# Patient Record
Sex: Male | Born: 1955 | Race: White | Hispanic: No | Smoking: Former smoker
Health system: Southern US, Community
[De-identification: ages and names within clinical notes are randomized; demographics above are authoritative.]

## PROBLEM LIST (undated history)

## (undated) DIAGNOSIS — F32A Depression, unspecified: Secondary | ICD-10-CM

## (undated) DIAGNOSIS — J449 Chronic obstructive pulmonary disease, unspecified: Secondary | ICD-10-CM

## (undated) DIAGNOSIS — R443 Hallucinations, unspecified: Secondary | ICD-10-CM

## (undated) DIAGNOSIS — F329 Major depressive disorder, single episode, unspecified: Secondary | ICD-10-CM

## (undated) DIAGNOSIS — J4489 Other specified chronic obstructive pulmonary disease: Secondary | ICD-10-CM

## (undated) DIAGNOSIS — R519 Headache, unspecified: Secondary | ICD-10-CM

## (undated) DIAGNOSIS — R51 Headache: Secondary | ICD-10-CM

## (undated) HISTORY — DX: Headache: R51

## (undated) HISTORY — DX: Headache, unspecified: R51.9

## (undated) HISTORY — DX: Depression, unspecified: F32.A

## (undated) HISTORY — DX: Major depressive disorder, single episode, unspecified: F32.9

## (undated) HISTORY — DX: Other specified chronic obstructive pulmonary disease: J44.89

## (undated) HISTORY — DX: Chronic obstructive pulmonary disease, unspecified: J44.9

---

## 2018-02-04 ENCOUNTER — Telehealth: Payer: Self-pay | Admitting: *Deleted

## 2018-02-04 NOTE — Telephone Encounter (Signed)
Copied from CRM #[id number]. Topic: Appointment Scheduling - Prior Auth Required for Appointment >> Feb 01, 2018  9:27 AM Poole, Shalonda wrote: No appointment has been scheduled. Patient is requesting new patient  appointment with  Dr. Bruce Burchette. His  sister diane Gingerich is a patient of Dr. Burchette. The patient is self pay and her from [place] with depression and anxiety concerns. His best contact number is [phone]  Per scheduling protocol, this appointment requires a prior authorization prior to scheduling.  Route to department's PEC pool. >> Feb 03, 2018 10:06 AM Richardson, Taren N, NT wrote: Pts sister checking status on pt getting approved to see Dr. Burchette. 

## 2018-02-04 NOTE — Telephone Encounter (Signed)
Copied from CRM [id number]. Topic: Appointment Scheduling - Prior Auth Required for Appointment >> Feb 01, 2018  9:27 AM Gaynelle AduPoole, Shalonda wrote: No appointment has been scheduled. Patient is requesting new patient  appointment with  Dr. Evelena PeatBruce Burchette. His  sister diane Steward RosGingerich is a patient of Dr. Caryl NeverBurchette. The patient is self pay and her from [place] with depression and anxiety concerns. His best contact number is [phone]  Per scheduling protocol, this appointment requires a prior authorization prior to scheduling.  Route to department's PEC pool. >> Feb 03, 2018 10:06 AM Arlyss Gandyichardson, Taren N, NT wrote: Pts sister checking status on pt getting approved to see Dr. Caryl NeverBurchette.

## 2018-02-07 NOTE — Telephone Encounter (Signed)
Please let them know I was out of town at conference last week.  OK to set up initial visit.

## 2018-02-11 ENCOUNTER — Ambulatory Visit: Payer: Self-pay | Admitting: Mental Health

## 2018-02-11 ENCOUNTER — Encounter: Payer: Self-pay | Admitting: Mental Health

## 2018-02-11 DIAGNOSIS — F331 Major depressive disorder, recurrent, moderate: Secondary | ICD-10-CM

## 2018-02-11 DIAGNOSIS — F319 Bipolar disorder, unspecified: Secondary | ICD-10-CM

## 2018-02-11 DIAGNOSIS — F411 Generalized anxiety disorder: Secondary | ICD-10-CM

## 2018-02-11 NOTE — Progress Notes (Signed)
[facility] Counselor Initial Adult Exam- Part I  Name: William AgarDavid Frye Date: 02/11/2018 MRN: [id number] DOB: 09/25/1955 PCP: Patient, No Pcp Per  Time spent: 45 minutes   Guardian/Payee: patient  Paperwork requested:  No   Reason for Visit /Presenting Problem: Unmedicated mood swings, currently depressed  Mental Status Exam:   Appearance:   Casual     Behavior:  Appropriate  Motor:  Restlestness  Speech/Language:   Normal Rate  Affect:  Depressed  Mood:  anxious and depressed  Thought process:  moody discouraged  Thought content:    Rumination  Sensory/Perceptual disturbances:    WNL  Orientation:  oriented to person, place and time/date  Attention:  Fair  Concentration:  Good  Memory:  WNL  Fund of knowledge:   Fair  Insight:    Fair  Judgment:   Fair  Impulse Control:  Poor   Reported Symptoms:  Depressed, anxious, poor impulse control, headaches.  Risk Assessment: Danger to Self:  No Self-injurious Behavior: No Danger to Others: No Duty to Warn:no Physical Aggression / Violence:No  Access to Firearms a concern: No  Gang Involvement:No  Patient / guardian was educated about steps to take if suicide or homicide risk level increases between visits: no While future psychiatric events cannot be accurately predicted, the patient does not currently require acute inpatient psychiatric care and does not [facility] meet [facility] criteria.  Substance Abuse History: Current substance abuse: daily smoker 1-2 packs    Past Psychiatric History:   Previous psychological history is significant for bipolar Outpatient Providers:none History of Psych Hospitalization: Yes  Psychological Testing: unknown    Medical History/Surgical History:not reviewed No past medical history on file.    Medications: No current outpatient medications on file.   No current facility-administered medications for this visit.     Allergies not on  file    Diagnoses:    ICD-10-CM   1. Bipolar I disorder (HCC) F31.9   2. Generalized anxiety disorder F41.1   3. Major depressive disorder, recurrent episode, moderate (HCC) F33.1   Abuse History: Victim: none Report needed: no Perpetrator of abuse: no Witness / Exposure to Domestic Violence:  none Protective Services Involvement: no Witness to [employer] Violence:  no   Family / Social History:    Living situation: with sister Sexual Orientation: straight Relationship Status:  divorced  Name of spouse / other:  If a parent, number of children / ages:   none  Support Systems: family  Surveyor, quantityinancial Stress:   Unemployed - no income  Income/Employment/Disability:   Counselling psychologistUnemployed  Military Service: none  Educational History:   Not asked  Religion/Sprituality/World View:    Christian  Any cultural differences that may affect / interfere with treatment:  No  Recreation/Hobbies:  Music - drummer, 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

## 2018-02-11 NOTE — Progress Notes (Deleted)
      [facility] Counselor/Therapist Progress Note  Patient ID: William Frye, MRN: [id number],    Date: 02/11/2018  Time Spent: 45 minutes   Treatment Type: Family with patient  Reported Symptoms: Depressed mood, Anxious Mood, Anhedonia, Irritability, Fatigue, Isolation and withdrawal, Somatic complaints and Impulsivity  Mental Status Exam:  Appearance:   Casual     Behavior:  Appropriate  Motor:  Normal  Speech/Language:   Clear and Coherent  Affect:  Flat  Mood:  anxious and depressed  Thought process:  normal  Thought content:    WNL  Sensory/Perceptual disturbances:    WNL  Orientation:  oriented to person, place and time/date  Attention:  Good  Concentration:  Good  Memory:  variable  Fund of knowledge:   Good  Insight:    Fair  Judgment:   Poor  Impulse Control:  Poor   Risk Assessment: Danger to Self:  No Self-injurious Behavior: No Danger to Others: No Duty to Warn:no Physical Aggression / Violence:No  Access to Firearms a concern: No  Gang Involvement:No   Subjective: Depressed, anxious. Wakes up in the morning fearful and doesn't know why. Staying with sister in town for the time being. Lifetime of mood swings, sever depression, bouts of mania, poor impulsive choices. Sleep adequate for now - takes melatonin. Polite and cooperative.   Interventions: {PSY:312-650-2180}  Diagnosis:   ICD-10-CM   1. Bipolar I disorder (HCC) F31.9     Plan:  See medication provider ASAP            Decrease consumption of soda            Stabilize mood            Self care program   William Frye, [facility]

## 2018-02-11 NOTE — Progress Notes (Deleted)
      [facility] Counselor/Therapist Progress Note  Patient ID: William Frye, MRN: [id number],    Date: 02/11/2018  Time Spent: ***   Treatment Type: {CHL AMB THERAPY TYPES:[id number]}  Reported Symptoms: {CHL AMB [facility] COUNSEL[facility] SYMP LIST:22089}  Mental Status Exam:  Appearance:   {PSY:22683}     Behavior:  {PSY:21022743}  Motor:  {PSY:22302}  Speech/Language:   {PSY:22685}  Affect:  {PSY:22687}  Mood:  {PSY:31886}  Thought process:  {PSY:31888}  Thought content:    {PSY:332-756-7601}  Sensory/Perceptual disturbances:    {PSY:856 515 4167}  Orientation:  {PSY:30297}  Attention:  {PSY:22877}  Concentration:  {PSY:786-605-5141}  Memory:  {[phone]}  Fund of knowledge:   {PSY:786-605-5141}  Insight:    {PSY:786-605-5141}  Judgment:   {PSY:786-605-5141}  Impulse Control:  {PSY:786-605-5141}   Risk Assessment: Danger to Self:  {PSY:22692} Self-injurious Behavior: {PSY:22692} Danger to Others: {PSY:22692} Duty to Warn:{PSY:311194} Physical Aggression / Violence:{PSY:21197} Access to Firearms a concern: {PSY:21197} Gang Involvement:{PSY:21197}  Subjective: ***   Interventions: {PSY:586-196-8522}  Diagnosis:   ICD-10-CM   1. Bipolar I disorder (HCC) F31.9   2. Generalized anxiety disorder F41.1   3. Major depressive disorder, recurrent episode, moderate (HCC) F33.1     Plan: ***  William Frye, LPC

## 2018-02-15 ENCOUNTER — Encounter: Payer: Self-pay | Admitting: Family Medicine

## 2018-02-15 ENCOUNTER — Other Ambulatory Visit: Payer: Self-pay

## 2018-02-15 ENCOUNTER — Ambulatory Visit (INDEPENDENT_AMBULATORY_CARE_PROVIDER_SITE_OTHER): Payer: Self-pay | Admitting: Family Medicine

## 2018-02-15 VITALS — BP 112/78 | HR 67 | Temp 98.0°F | Ht 73.0 in | Wt 263.1 lb

## 2018-02-15 DIAGNOSIS — R51 Headache: Secondary | ICD-10-CM

## 2018-02-15 DIAGNOSIS — F3010 Manic episode without psychotic symptoms, unspecified: Secondary | ICD-10-CM

## 2018-02-15 DIAGNOSIS — R519 Headache, unspecified: Secondary | ICD-10-CM

## 2018-02-15 DIAGNOSIS — R6889 Other general symptoms and signs: Secondary | ICD-10-CM

## 2018-02-15 DIAGNOSIS — Z87898 Personal history of other specified conditions: Secondary | ICD-10-CM

## 2018-02-15 DIAGNOSIS — F322 Major depressive disorder, single episode, severe without psychotic features: Secondary | ICD-10-CM

## 2018-02-15 DIAGNOSIS — R5383 Other fatigue: Secondary | ICD-10-CM

## 2018-02-15 LAB — COMPREHENSIVE METABOLIC PANEL
ALT: 19 U/L (ref 0–53)
AST: 20 U/L (ref 0–37)
Albumin: 4.4 g/dL (ref 3.5–5.2)
Alkaline Phosphatase: 136 U/L — ABNORMAL HIGH (ref 39–117)
BUN: 15 mg/dL (ref 6–23)
CO2: 28 mEq/L (ref 19–32)
Calcium: 9.5 mg/dL (ref 8.4–10.5)
Chloride: 103 mEq/L (ref 96–112)
Creatinine, Ser: 1.05 mg/dL (ref 0.40–1.50)
GFR: 76.39 mL/min (ref >60.00)
Glucose, Bld: 103 mg/dL — ABNORMAL HIGH (ref 70–99)
Potassium: 4.3 mEq/L (ref 3.5–5.1)
Sodium: 140 mEq/L (ref 135–145)
Total Bilirubin: 0.8 mg/dL (ref 0.2–1.2)
Total Protein: 6.7 g/dL (ref 6.0–8.3)

## 2018-02-15 LAB — CBC WITH DIFFERENTIAL/PLATELET
BASOS ABS: 0 10*3/uL (ref 0.0–0.1)
Basophils Relative: 0.7 % (ref 0.0–3.0)
Eosinophils Absolute: 0.1 10*3/uL (ref 0.0–0.7)
Eosinophils Relative: 0.8 % (ref 0.0–5.0)
HCT: 52.8 % — ABNORMAL HIGH (ref 39.0–52.0)
HEMOGLOBIN: 17.7 g/dL — AB (ref 13.0–17.0)
Lymphocytes Relative: 19.9 % (ref 12.0–46.0)
Lymphs Abs: 1.3 10*3/uL (ref 0.7–4.0)
MCHC: 33.6 g/dL (ref 30.0–36.0)
MCV: 92.7 fl (ref 78.0–100.0)
Monocytes Absolute: 0.5 10*3/uL (ref 0.1–1.0)
Monocytes Relative: 7.1 % (ref 3.0–12.0)
Neutro Abs: 4.8 10*3/uL (ref 1.4–7.7)
Neutrophils Relative %: 71.5 % (ref 43.0–77.0)
Platelets: 235 10*3/uL (ref 150.0–400.0)
RBC: 5.69 Mil/uL (ref 4.22–5.81)
RDW: 14.5 % (ref 11.5–15.5)
WBC: 6.7 10*3/uL (ref 4.0–10.5)

## 2018-02-15 LAB — PSA: PSA: 5.89 ng/mL — ABNORMAL HIGH (ref 0.10–4.00)

## 2018-02-15 LAB — TSH: TSH: 0.91 u[IU]/mL (ref 0.35–4.50)

## 2018-02-15 MED ORDER — DIVALPROEX SODIUM 250 MG PO DR TAB: 250.0000 mg | DELAYED_RELEASE_TABLET | Freq: Three times a day (TID) | ORAL | 1 refills | Status: DC

## 2018-02-15 MED ORDER — FLUOXETINE HCL 20 MG PO TABS: 20.0000 mg | ORAL_TABLET | Freq: Every day | ORAL | 3 refills | Status: DC

## 2018-02-15 NOTE — Patient Instructions (Signed)
Keep follow up with psychiatry as scheduled  We will call with lab results  Follow up for any concerning side effects with medications.

## 2018-02-15 NOTE — Progress Notes (Signed)
Subjective:     Patient ID: William Frye, male   DOB: 12/13/1955, 62 y.o.   MRN: [id number]  HPI Patient is seen to establish care.  He just recently moved here from [place] to live with his sister short-term.  They describe that for the past 25 years or more he has had patterns of cycling between "highs and lows ".  He has had significant depression symptoms over the past several months.  He was working full-time up until November 5, of this year when he felt anxiety and depression were overwhelming him.  There is a strong family history of depression and patient had 1 brother that committed suicide back in 2007.  There have been several other relatives with depression in the past.  He states he has had multiple episodes in the past where he feels high energy or little need for sleep and also frequent impulsive behaviors such as excessive spending.  Patient reportedly had suicide attempt back in 2003.  He has scheduled appointment with local psychiatrist but this is not until January 31.  He is established currently with a counselor with [facility] and they recommended he get an now to consider antidepressant and mood stabilizer while waiting for appointment with psychiatrist.  He has multiple symptoms of depression including -Increased fatigue and lack of energy -Anhedonia and general lack of desire to be engage in normal activities -Difficulty focusing and concentrating -Loss of appetite -Sleep disturbance -Some impairment in cognitive functioning  Additionally he states that he feels cold much of the time he has reported some recent mild slow urine stream.  Father reportedly had prostate cancer.  Patient apparently had PSA that was on the "high side of normal "when tested 2018.  He was referred to urologist but never went.  Also relates almost daily bifrontal diffuse headaches which are relatively dull.  No associated nausea or vomiting.  No visual changes.  No focal  neurologic deficits.  Patient does have a longstanding history of smoking and has been told he has COPD.  Rare alcohol use.  Denies any illicit drug use.  Does consume considerable caffeine is trying to taper off  He apparently was treated with multiple medications in the past including multiple mood stabilizers and antidepressants.  He remembers being on Seroquel but had severe side effects of sedation and dizziness.  He apparently also has taken Tegretol and Lamictal but had some type of side effects with those.  He was on Wellbutrin once but does not recall the response.  He denies any active suicidal ideation at this time but does have some fleeting thoughts that "I would be better off dead ".  He feels the anxiety is weighing in and equally as the depression and anxiety symptoms seem to be worse early in the morning.  Past Medical History:  Diagnosis Date  . Depression   . Emphysema with chronic bronchitis (HCC)   . Frequent headaches    History reviewed. No pertinent surgical history.  reports that he has been smoking cigarettes. He has never used smokeless tobacco. He reports previous alcohol use. He reports that he does not use drugs. family history includes COPD in his father; Cancer in his father; Depression in his brother and sister; Drug abuse in his brother; Early death in his brother; Hyperlipidemia in his mother; Mental illness in his brother. No Known Allergies   Review of Systems  Constitutional: Positive for fatigue. Negative for chills and fever.  Respiratory: Negative for cough.  Cardiovascular: Negative for chest pain.  Gastrointestinal: Negative for abdominal pain.  Neurological: Positive for headaches. Negative for dizziness, seizures, syncope, speech difficulty and weakness.  Psychiatric/Behavioral: Positive for dysphoric mood and sleep disturbance. Negative for confusion, hallucinations, self-injury and suicidal ideas. The patient is nervous/anxious.         Objective:   Physical Exam Constitutional:      Appearance: Normal appearance.  Cardiovascular:     Rate and Rhythm: Normal rate and regular rhythm.  Pulmonary:     Comments: Has somewhat diminished breath sounds throughout.  No wheezes.  No rales.  Pulse oximetry 96% Musculoskeletal:        General: No swelling.  Neurological:     General: No focal deficit present.     Mental Status: He is alert and oriented to person, place, and time.     Cranial Nerves: No cranial nerve deficit.  Psychiatric:     Comments: PHQ 9 score is 27        Assessment:     #1 longstanding history of recurrent depression and what sounds like clear manic episodes.  Strong suspicion for bipolar disorder.  No active suicidal ideation currently.  Currently depressed and anxious.  #2 fatigue.  He also reports some cold intolerance.  Rule out hypothyroidism  #3 Long history of nicotine use and reported COPD  #4 history of reported borderline elevated PSA  #5 frequent daily headaches which sound most likely related to chronic tension headache with possible element of caffeine withdrawal    Plan:     -We recommended some screening labs with CBC, comprehensive metabolic panel, TSH, PSA -Start mood stabilizer with Depakote 250 mg 3 times daily -Start Prozac 20 mg once daily -Close follow-up with psychiatry as scheduled -Patient needs to quit smoking but we discussed the fact this may not be the most opportune time -Gradually reduce caffeine intake and touch base if headaches not improving over the next couple of weeks  Kristian Covey MD [facility]

## 2018-03-04 ENCOUNTER — Encounter

## 2018-03-04 ENCOUNTER — Ambulatory Visit: Payer: Self-pay | Admitting: Psychiatry

## 2018-03-11 ENCOUNTER — Ambulatory Visit: Payer: Self-pay | Admitting: Mental Health

## 2018-04-01 ENCOUNTER — Ambulatory Visit: Payer: Self-pay | Admitting: Psychiatry

## 2018-04-01 ENCOUNTER — Encounter: Payer: Self-pay | Admitting: Psychiatry

## 2018-04-01 VITALS — BP 133/84 | HR 61 | Ht 75.0 in | Wt 240.0 lb

## 2018-04-01 DIAGNOSIS — F3181 Bipolar II disorder: Secondary | ICD-10-CM

## 2018-04-01 DIAGNOSIS — F411 Generalized anxiety disorder: Secondary | ICD-10-CM

## 2018-04-01 MED ORDER — BUSPIRONE HCL 30 MG PO TABS: ORAL_TABLET | ORAL | 2 refills | Status: DC

## 2018-04-01 MED ORDER — LITHIUM CARBONATE 300 MG PO CAPS: ORAL_CAPSULE | ORAL | 1 refills | Status: DC

## 2018-04-01 NOTE — Progress Notes (Signed)
[facility] MD/PA/NP Initial Note  04/01/2018 2:12 PM William Frye  MRN:  [id number]  Chief Complaint:  Chief Complaint    Other; Depression; Anxiety      HPI: Seen with sister William Frye 63 yo. WM.  Sisters have pushed his psych treatment.  Not been believer in bipolar disorder.  Never recognized highs as highs.  Spring 2019 people started saying something to him aobut it.  Got so low he couldn't function.  Quit work after 2-3 years.  Still not right and extremely uptight and nervous.  Reached a low point and sister offered help.    Depressed currently.  Pt reports that mood is Anxious and Depressed and describes anxiety as Moderate. Anxiety symptoms include: Excessive Worry,. Awakens frightened without reason. Pt reports no sleep issues. Pt reports that appetite is good.  Not a good diet. But has dropped caffeiene.  Lost 30# with better eating. Pt reports that energy is poor and anhedonia, loss of interest or pleasure in usual activities, poor motivation and withdrawn from usual activities. Concentration is poor. Suicidal thoughts:  denied by patient. SA 2008 OD sleeping pills and booze. In bed a lot.  Normally high energy and productive.  Last high usually last a long period.  Last 2018 spring.  On a high for 2-3 years.  Sister noted a elation and excess spending, mother bailing him out, hypersexual and grandiose.  Jokes a lot.  Reduced sleep.  Up all night.  No legal problems.  Depressions may last years also.  Denies drug and alcohol abuse history of any significance.  He does not currently use drugs or alcohol.  He quit smoking 1 week ago after smoking for 30 years.   Visit Diagnosis:    ICD-10-CM   1. Bipolar II disorder (HCC) F31.81   2. Generalized anxiety disorder F41.1     Past Psychiatric History: about 10 years ago saw psychiatrist in [place]. Seroquel SE,  Past Medical History:  Past Medical History:  Diagnosis Date  . Depression   . Emphysema with chronic  bronchitis (HCC)   . Frequent headaches    History reviewed. No pertinent surgical history.  Family Psychiatric History: B suicide and substance abuse and self diagnosed Asberger's. Nephew suicide. Family History:  Family History  Problem Relation Age of Onset  . Hyperlipidemia Mother   . COPD Father   . Cancer Father   . Depression Brother   . Drug abuse Brother   . Early death Brother   . Mental illness Brother   . Depression Sister     Social History:  Social History   Socioeconomic History  . Marital status: Divorced    Spouse name: Not on file  . Number of children: Not on file  . Years of education: Not on file  . Highest education level: Not on file  Occupational History  . Not on file  Social Needs  . Financial resource strain: Not on file  . Food insecurity:    Worry: Not on file    Inability: Not on file  . Transportation needs:    Medical: Not on file    Non-medical: Not on file  Tobacco Use  . Smoking status: Current Every Day Smoker    Types: Cigarettes  . Smokeless tobacco: Never Used  Substance and Sexual Activity  . Alcohol use: Not Currently  . Drug use: Never  . Sexual activity: Not Currently    Partners: Female  Lifestyle  . Physical activity:  Days per week: Not on file    Minutes per session: Not on file  . Stress: Not on file  Relationships  . Social connections:    Talks on phone: Not on file    Gets together: Not on file    Attends religious service: Not on file    Active member of club or organization: Not on file    Attends meetings of clubs or organizations: Not on file    Relationship status: Not on file  Other Topics Concern  . Not on file  Social History Narrative  . Not on file    Allergies: No Known Allergies  Metabolic Disorder Labs: No results found for: HGBA1C, MPG No results found for: PROLACTIN No results found for: CHOL, TRIG, HDL, CHOLHDL, VLDL, LDLCALC Lab Results  Component Value Date   TSH 0.91  02/15/2018    Therapeutic Level Labs: No results found for: LITHIUM No results found for: VALPROATE No components found for:  CBMZ  Current Medications: Current Outpatient Medications  Medication Sig Dispense Refill  . aspirin 500 MG tablet Take 500 mg by mouth every 6 (six) hours as needed for pain.    . divalproex (DEPAKOTE) 250 MG DR tablet Take 1 tablet (250 mg total) by mouth 3 (three) times daily. 90 tablet 1  . FLUoxetine (PROZAC) 20 MG tablet Take 1 tablet (20 mg total) by mouth daily. 30 tablet 3  . busPIRone (BUSPAR) 30 MG tablet 1/3 twice daily for 5 days, then 2/3 twice daily, then 1 twice daily 60 tablet 2  . lithium carbonate 300 MG capsule 1 for 3 days, then 2 at night for 5 nights, then 3 at night 90 capsule 1  . Theanine 50 MG TBDP Take 100 mg by mouth daily.     No current facility-administered medications for this visit.     Medication Side Effects: dizziness/lightheadedness  Orders placed this visit:  No orders of the defined types were placed in this encounter.   Psychiatric Specialty Exam:  Review of Systems  Constitutional: Positive for malaise/fatigue.  HENT: Negative.   Eyes: Negative.   Respiratory: Negative.  Negative for cough and shortness of breath.   Cardiovascular: Negative.   Gastrointestinal: Negative.   Genitourinary: Positive for frequency.  Musculoskeletal: Negative.   Skin: Negative.   Neurological: Positive for dizziness, tingling and headaches. Negative for tremors, seizures, loss of consciousness and weakness.  Endo/Heme/Allergies: Negative.   Psychiatric/Behavioral: Positive for depression. Negative for hallucinations, memory loss, substance abuse and suicidal ideas. The patient is nervous/anxious. The patient does not have insomnia.     Blood pressure 133/84, pulse 61, height 6\' 3"  (1.905 m), weight 240 lb (108.9 kg).Body mass index is 30 kg/m.  General Appearance: Disheveled  Eye Contact:  Good  Speech:  Clear and Coherent,  Normal Rate and Talkative  Volume:  Normal  Mood:  Anxious, Depressed and Dysphoric  Affect:  Full Range  Thought Process:  Coherent and Goal Directed  Orientation:  Full (Time, Place, and Person)  Thought Content: Logical   Suicidal Thoughts:  No  Homicidal Thoughts:  No  Memory:  WNL  Judgement:  Good  Insight:  Good  Psychomotor Activity:  Normal  Concentration:  Concentration: Fair  Recall:  Good  Fund of Knowledge: Good  Language: Good  Assets:  Communication Skills Desire for Improvement Intimacy Leisure Time Physical Health Social Support Talents/Skills Transportation  ADL's:  Intact  Cognition: WNL  Prognosis:  Good   Screenings:  PHQ2-9  Office Visit from 02/15/2018 in [facility] [place]  PHQ-2 Total Score  6  PHQ-9 Total Score  27      Receiving Psychotherapy: No   Treatment Plan/Recommendations:  Neerav and his sister [name] describe a long history of untreated bipolar disorder with periods of depression or hypomania that may last over a year.  He denies psychotic symptoms associated with either mood state.  However he has very classic bipolar disorder.  At this point we will call it bipolar type II since the manic symptoms do not appear to be highly disruptive nor does he appear to lose touch with reality.  We discussed the variety of mood stabilizer options and the side effects including Depakote, lithium, and the atypical antipsychotic mood stabilizers.  Given the long cycles, this predicts a good response to lithium and the potential for lithium monotherapy.  He has no health insurance this would also be the least expensive option.  Counseled patient regarding potential benefits, risks, and side effects of lithium to include potential risk of lithium affecting thyroid and renal function.  Discussed need for periodic lab monitoring to determine drug level and to assess for potential adverse effects.  Counseled patient regarding signs and symptoms  of lithium toxicity and advised that they notify office immediately or seek urgent medical attention if experiencing these signs and symptoms.  Patient advised to contact office with any questions or concerns.   We will start with lithium 300 mg a day and increase to 900 mg a day over 8 days.  We will wait until follow-up to do a blood level for cost reasons.  Given his body size the risk of lithium toxicity at this dosage should be low.  DC Prozac because it can induce mania.  Also DC Depakote because of her changing to lithium as the mood stabilizer.  He asked for something for anxiety as well.  Would prefer to use something with low abuse potential therefore we will not choose a benzo and with low risk of inducing mania there we will therefore we will not choose an SSRI.  Buspirone would seem to be the most logical choice.  Discussed side effects in detail.  He will start with 10 mg at twice daily and work up to 30 mg twice daily over 2 weeks  At follow-up of is not showing significant improvement but is tolerating the lithium well with increase lithium to 1200 mg a day and then check a blood level.  Answered questions about genetic testing such as gene site.  This does not seem necessary at this time but we can keep an open mind about it in the future should he need alternative medications.  Answered questions about bipolar 1 versus bipolar 2 discussed the diagnoses in detail as well as the prognostic indicators.  This appointment was over an hour.  Follow-up 4 weeks  Meredith Staggers, MD, DFAPA   Lauraine Rinne, MD

## 2018-04-04 ENCOUNTER — Other Ambulatory Visit: Payer: Self-pay

## 2018-04-04 MED ORDER — BUSPIRONE HCL 30 MG PO TABS: ORAL_TABLET | ORAL | 2 refills | Status: DC

## 2018-04-29 ENCOUNTER — Encounter: Payer: Self-pay | Admitting: Psychiatry

## 2018-04-29 ENCOUNTER — Ambulatory Visit: Payer: Self-pay | Admitting: Psychiatry

## 2018-04-29 VITALS — Wt 250.0 lb

## 2018-04-29 DIAGNOSIS — F411 Generalized anxiety disorder: Secondary | ICD-10-CM

## 2018-04-29 DIAGNOSIS — F3181 Bipolar II disorder: Secondary | ICD-10-CM

## 2018-04-29 MED ORDER — LITHIUM CARBONATE ER 300 MG PO TBCR: 1200.0000 mg | EXTENDED_RELEASE_TABLET | Freq: Every day | ORAL | 1 refills | Status: DC

## 2018-04-29 NOTE — Progress Notes (Signed)
William BillsDavid S Frye [id number] 04/26/1955 63 y.o.  Subjective:   Patient ID:  William BillsDavid S Frye is a 63 y.o. (DOB 03/11/1955) male.  Chief Complaint:  Chief Complaint  Patient presents with  . Follow-up    Medication Management  . Medication Problem    diarrhea  . Depression  . Anxiety    HPI  Seen with sister who's very involved and taking notes. William Frye presents to the office today for follow-up of bipolar and anxiety.  RX lithium and buspirone.  He only started lithium.  Did not take buspirone. Stopped Prozac and Depakote.    I think the lithium is having some positive effects a little less anxious but still depressed.  Feels a little   Per sister, Appetite is better and gained 10#.  He's going out more, more active, talking on Facebook, more initiative, no headaches.  Sleep pretty good with melatonin.  Still in bed most of the day.  Depression is worse than the anxiety.     Review of Systems:  Review of Systems  Gastrointestinal: Positive for diarrhea.  Neurological: Negative for tremors and weakness.  Psychiatric/Behavioral: Positive for dysphoric mood. Negative for agitation, behavioral problems, confusion, decreased concentration, hallucinations, self-injury, sleep disturbance and suicidal ideas. The patient is nervous/anxious. The patient is not hyperactive.     Medications: I have reviewed the patient's current medications.  Current Outpatient Medications  Medication Sig Dispense Refill  . Theanine 50 MG TBDP Take 100 mg by mouth daily.    . busPIRone (BUSPAR) 30 MG tablet 1/3 twice daily for 5 days, then 2/3 twice daily for 5 days, then 1 twice daily (Patient not taking: Reported on 04/29/2018) 60 tablet 2  . lithium carbonate (LITHOBID) 300 MG CR tablet Take 4 tablets (1,200 mg total) by mouth at bedtime. 120 tablet 1   No current facility-administered medications for this visit.     Medication Side Effects: Other: diarrhea intermittent.  A couple times in a  day.  Allergies: No Known Allergies  Past Medical History:  Diagnosis Date  . Depression   . Emphysema with chronic bronchitis (HCC)   . Frequent headaches     Family History  Problem Relation Age of Onset  . Hyperlipidemia Mother   . COPD Father   . Cancer Father   . Depression Brother   . Drug abuse Brother   . Early death Brother   . Mental illness Brother   . Depression Sister     Social History   Socioeconomic History  . Marital status: Divorced    Spouse name: Not on file  . Number of children: Not on file  . Years of education: Not on file  . Highest education level: Not on file  Occupational History  . Not on file  Social Needs  . Financial resource strain: Not on file  . Food insecurity:    Worry: Not on file    Inability: Not on file  . Transportation needs:    Medical: Not on file    Non-medical: Not on file  Tobacco Use  . Smoking status: Current Every Day Smoker    Types: Cigarettes  . Smokeless tobacco: Never Used  Substance and Sexual Activity  . Alcohol use: Not Currently  . Drug use: Never  . Sexual activity: Not Currently    Partners: Female  Lifestyle  . Physical activity:    Days per week: Not on file    Minutes per session: Not on file  .  Stress: Not on file  Relationships  . Social connections:    Talks on phone: Not on file    Gets together: Not on file    Attends religious service: Not on file    Active member of club or organization: Not on file    Attends meetings of clubs or organizations: Not on file    Relationship status: Not on file  . Intimate partner violence:    Fear of current or ex partner: Not on file    Emotionally abused: Not on file    Physically abused: Not on file    Forced sexual activity: Not on file  Other Topics Concern  . Not on file  Social History Narrative  . Not on file    Past Medical History, Surgical history, Social history, and Family history were reviewed and updated as appropriate.    Please see review of systems for further details on the patient's review from today.   Objective:   Physical Exam:  Wt 250 lb (113.4 kg)   BMI 31.25 kg/m   Physical Exam Constitutional:      General: He is not in acute distress.    Appearance: He is well-developed.  Musculoskeletal:        General: No deformity.  Neurological:     Mental Status: He is alert and oriented to person, place, and time.     Motor: No tremor.     Coordination: Coordination normal.     Gait: Gait normal.  Psychiatric:        Attention and Perception: Attention normal. He is attentive.        Mood and Affect: Mood is anxious and depressed. Affect is not labile, blunt, angry or inappropriate.        Speech: Speech normal.        Behavior: Behavior normal.        Thought Content: Thought content normal. Thought content does not include homicidal or suicidal ideation. Thought content does not include homicidal or suicidal plan.        Cognition and Memory: Cognition normal.        Judgment: Judgment normal.     Comments: Insight is fair.   They had a lot of questions.  Lab Review:     Component Value Date/Time   NA 140 02/15/2018 1141   K 4.3 02/15/2018 1141   CL 103 02/15/2018 1141   CO2 28 02/15/2018 1141   GLUCOSE 103 (H) 02/15/2018 1141   BUN 15 02/15/2018 1141   CREATININE 1.05 02/15/2018 1141   CALCIUM 9.5 02/15/2018 1141   PROT 6.7 02/15/2018 1141   ALBUMIN 4.4 02/15/2018 1141   AST 20 02/15/2018 1141   ALT 19 02/15/2018 1141   ALKPHOS 136 (H) 02/15/2018 1141   BILITOT 0.8 02/15/2018 1141       Component Value Date/Time   WBC 6.7 02/15/2018 1141   RBC 5.69 02/15/2018 1141   HGB 17.7 (H) 02/15/2018 1141   HCT 52.8 (H) 02/15/2018 1141   PLT 235.0 02/15/2018 1141   MCV 92.7 02/15/2018 1141   MCHC 33.6 02/15/2018 1141   RDW 14.5 02/15/2018 1141   LYMPHSABS 1.3 02/15/2018 1141   MONOABS 0.5 02/15/2018 1141   EOSABS 0.1 02/15/2018 1141   BASOSABS 0.0 02/15/2018 1141    No  results found for: POCLITH, LITHIUM   No results found for: PHENYTOIN, PHENOBARB, VALPROATE, CBMZ   .res Assessment: Plan:    Bipolar II disorder (HCC) - Plan: lithium carbonate (  LITHOBID) 300 MG CR tablet, Lithium level  Generalized anxiety disorder   Greater than 50% of face to face time with patient was spent on counseling and coordination of care. We discussed multiple issues.  Again we discussed his diagnosis of bipolar type II with long slow cycles and mood.  This is predictive of a good response to lithium.  It was emphasized that once stability is obtained with the medication that he will need to take it life long.  Jaxxson has had some noticeable improvement both to himself as well as to the family with the addition of lithium to 900 mg daily.  He feels better off the Prozac and Depakote.  He still has significant residual depression.  The anxiety is improved.  He is having some problems tolerating the lithium with intermittent diarrhea with a couple of bowel movements daily.  Extensive discussion about how to manage the diarrhea.  Encouraged him to try it multiple times of day spreading out the lithium or taking it all at once.  Also experiment with times of day.  In addition we will prescribe slow release lithium to see if that is better.  Also recommend he try Imodium half tablet every day or every other day as needed if needed.  Encourage plenty of fluids.  Counseled patient regarding potential benefits, risks, and side effects of lithium to include potential risk of lithium affecting thyroid and renal function.  Discussed need for periodic lab monitoring to determine drug level and to assess for potential adverse effects.  Counseled patient regarding signs and symptoms of lithium toxicity and advised that they notify office immediately or seek urgent medical attention if experiencing these signs and symptoms.  Discussed drug drug interactions including NSAIDs.  Patient advised to contact  office with any questions or concerns.    Check lithium level  This appt was 30 mins.  Follow-up 1 month  Meredith Staggers MD, DFAPA  Please see After Visit Summary for patient specific instructions.  No future appointments.  Orders Placed This Encounter  Procedures  . Lithium level      -------------------------------

## 2018-04-29 NOTE — Patient Instructions (Signed)
Immodium 1/2 tablet up to daily.

## 2018-05-03 LAB — LITHIUM LEVEL: Lithium Lvl: 0.7 mmol/L (ref 0.6–1.2)

## 2018-05-04 ENCOUNTER — Other Ambulatory Visit: Payer: Self-pay | Admitting: Psychiatry

## 2018-05-04 MED ORDER — LITHIUM CARBONATE 150 MG PO CAPS: 150.0000 mg | ORAL_CAPSULE | Freq: Every day | ORAL | 0 refills | Status: DC

## 2018-05-04 NOTE — Progress Notes (Signed)
Lithium level on 900 mg nightly was 0.7.  He has had some improvement but also some tolerability problems.  We changed the lithium capsules to extended release lithium tablets.  We will add the least amount of additional lithium possible 150 mg nightly to provide additional mood benefit.  Prescription sent in.

## 2018-05-04 NOTE — Progress Notes (Signed)
Called and left Pt. A VM to return my call.

## 2018-05-04 NOTE — Progress Notes (Signed)
Pt. Called back. He stated that he has only been taking 900 Mg daily. I informed him that you would be adding a 150 Mg in addition to the 900 Mg he is already taking. He stated that he did need a new Rx for the 300 Mg and verbalized understanding to start the additional 150 Mg. Pt. Stated to the the Rx to the pharmacy you last sent the Li to.

## 2018-05-24 ENCOUNTER — Other Ambulatory Visit: Payer: Self-pay | Admitting: Psychiatry

## 2018-05-24 ENCOUNTER — Telehealth: Payer: Self-pay | Admitting: Psychiatry

## 2018-05-24 MED ORDER — LITHIUM CARBONATE 300 MG PO CAPS: 900.0000 mg | ORAL_CAPSULE | Freq: Every day | ORAL | 1 refills | Status: DC

## 2018-05-24 MED ORDER — LITHIUM CARBONATE 300 MG PO TABS: 300.0000 mg | ORAL_TABLET | Freq: Three times a day (TID) | ORAL | 1 refills | Status: DC

## 2018-05-24 NOTE — Telephone Encounter (Signed)
Tried to reach pt but able to leave message

## 2018-05-24 NOTE — Progress Notes (Signed)
Patient wants to change back to lithium carbonate capsules from lithium CR.  That is okay.  That makes his current dose lithium carbonate 300 mg capsules 3 daily and lithium 150 mg capsules 1 daily.  We will send in prescription for these dosages for 1 month no refills

## 2018-05-24 NOTE — Telephone Encounter (Signed)
The patient has been on Lithobid or lithium CR tablets 3 of the 300 tablets at night and 1 of the 150 mg capsules, immediate release, also.  What is actually asking for is to switch back to all lithium immediate release capsules.  Therefore a prescription was sent in for lithium 300 mg capsules 3 daily and lithium 150 mg capsule 1 daily to [facility].  Please inform the patient he can pick that up whenever he wishes.

## 2018-05-24 NOTE — Telephone Encounter (Signed)
Patient called and said that he would like to go to the lithuim regular and not the early release for the 900 mg. Please let him know if this would be ok. He wants to try this before his next appt on April 2 nd

## 2018-06-02 ENCOUNTER — Ambulatory Visit: Payer: Self-pay | Admitting: Psychiatry

## 2018-06-02 ENCOUNTER — Encounter: Payer: Self-pay | Admitting: Psychiatry

## 2018-06-02 ENCOUNTER — Other Ambulatory Visit: Payer: Self-pay

## 2018-06-02 DIAGNOSIS — F411 Generalized anxiety disorder: Secondary | ICD-10-CM

## 2018-06-02 DIAGNOSIS — F3181 Bipolar II disorder: Secondary | ICD-10-CM

## 2018-06-02 MED ORDER — LITHIUM CARBONATE 300 MG PO CAPS: 1200.0000 mg | ORAL_CAPSULE | Freq: Every day | ORAL | 1 refills | Status: DC

## 2018-06-02 NOTE — Progress Notes (Signed)
William Frye Hopfensperger [id number] 03/14/1955 63 y.o.  Subjective:   Patient ID:  William Frye Jamerson is a 63 y.o. (DOB 07/28/1955) male.  Chief Complaint:  Chief Complaint  Patient presents with  . Manic Behavior  . Follow-up    med mangement  . Depression    HPI William Frye Mcquain presents to the office today for follow-up of bipolar.  Last visit Feb 28.  A lot better with lithium but not himself yet.  Wonders about increasing the lithium.  Still some depression.  Anxiety under control.  Lack of energy and motivation.  Can laugh.  OK with lithium capsules now, no diarrhea.  Past psychiatric medications include Depakote and Prozac.  Seroquel side effects  Review of Systems:  Review of Systems  Gastrointestinal: Negative for diarrhea.  Neurological: Negative for tremors and weakness.    Medications: I have reviewed the patient'Frye current medications.  Current Outpatient Medications  Medication Sig Dispense Refill  . lithium carbonate 300 MG capsule Take 4 capsules (1,200 mg total) by mouth at bedtime. 120 capsule 1  . Theanine 50 MG TBDP Take 100 mg by mouth daily.     No current facility-administered medications for this visit.     Medication Side Effects: None  Allergies: No Known Allergies  Past Medical History:  Diagnosis Date  . Depression   . Emphysema with chronic bronchitis (HCC)   . Frequent headaches     Family History  Problem Relation Age of Onset  . Hyperlipidemia Mother   . COPD Father   . Cancer Father   . Depression Brother   . Drug abuse Brother   . Early death Brother   . Mental illness Brother   . Depression Sister     Social History   Socioeconomic History  . Marital status: Divorced    Spouse name: Not on file  . Number of children: Not on file  . Years of education: Not on file  . Highest education level: Not on file  Occupational History  . Not on file  Social Needs  . Financial resource strain: Not on file  . Food insecurity:    Worry: Not  on file    Inability: Not on file  . Transportation needs:    Medical: Not on file    Non-medical: Not on file  Tobacco Use  . Smoking status: Current Every Day Smoker    Types: Cigarettes  . Smokeless tobacco: Never Used  Substance and Sexual Activity  . Alcohol use: Not Currently  . Drug use: Never  . Sexual activity: Not Currently    Partners: Female  Lifestyle  . Physical activity:    Days per week: Not on file    Minutes per session: Not on file  . Stress: Not on file  Relationships  . Social connections:    Talks on phone: Not on file    Gets together: Not on file    Attends religious service: Not on file    Active member of club or organization: Not on file    Attends meetings of clubs or organizations: Not on file    Relationship status: Not on file  . Intimate partner violence:    Fear of current or ex partner: Not on file    Emotionally abused: Not on file    Physically abused: Not on file    Forced sexual activity: Not on file  Other Topics Concern  . Not on file  Social History Narrative  . Not  on file    Past Medical History, Surgical history, Social history, and Family history were reviewed and updated as appropriate.   Please see review of systems for further details on the patient'Frye review from today.   Objective:   Physical Exam:  There were no vitals taken for this visit.  Physical Exam Neurological:     Mental Status: He is alert and oriented to person, place, and time.     Cranial Nerves: No dysarthria.  Psychiatric:        Attention and Perception: Attention normal.        Mood and Affect: Mood is depressed.        Speech: Speech normal.        Behavior: Behavior is cooperative.        Thought Content: Thought content normal. Thought content is not paranoid or delusional. Thought content does not include homicidal or suicidal ideation. Thought content does not include homicidal or suicidal plan.        Cognition and Memory: Cognition and  memory normal.        Judgment: Judgment normal.     Comments: Insight fair.  No manic symptoms     Lab Review:     Component Value Date/Time   NA 140 02/15/2018 1141   K 4.3 02/15/2018 1141   CL 103 02/15/2018 1141   CO2 28 02/15/2018 1141   GLUCOSE 103 (H) 02/15/2018 1141   BUN 15 02/15/2018 1141   CREATININE 1.05 02/15/2018 1141   CALCIUM 9.5 02/15/2018 1141   PROT 6.7 02/15/2018 1141   ALBUMIN 4.4 02/15/2018 1141   AST 20 02/15/2018 1141   ALT 19 02/15/2018 1141   ALKPHOS 136 (H) 02/15/2018 1141   BILITOT 0.8 02/15/2018 1141       Component Value Date/Time   WBC 6.7 02/15/2018 1141   RBC 5.69 02/15/2018 1141   HGB 17.7 (H) 02/15/2018 1141   HCT 52.8 (H) 02/15/2018 1141   PLT 235.0 02/15/2018 1141   MCV 92.7 02/15/2018 1141   MCHC 33.6 02/15/2018 1141   RDW 14.5 02/15/2018 1141   LYMPHSABS 1.3 02/15/2018 1141   MONOABS 0.5 02/15/2018 1141   EOSABS 0.1 02/15/2018 1141   BASOSABS 0.0 02/15/2018 1141    Lithium Lvl  Date Value Ref Range Status  05/02/2018 0.7 0.6 - 1.2 mmol/L Final     No results found for: PHENYTOIN, PHENOBARB, VALPROATE, CBMZ   .res Assessment: Plan:    Bipolar II disorder (HCC) - Plan: Lithium level  Generalized anxiety disorder  Jermiah'Frye anxiety has revolved resolved on the lithium.  His bipolar depression has nightly not completely remitted.  He is doing better on lithium than he did with Depakote and Prozac.  He feels less depressed than then.  He still has some residual depressive symptoms and wonders about going up in the dosage.  His diarrhea has resolved.  Increase lithium to 300 mg capsules, 4 nightly.  Prescription sent in. Discontinue lithium 150 capsules  He no longer feels he needs the buspirone so he is not taking that.  Counseled patient regarding potential benefits, risks, and side effects of lithium to include potential risk of lithium affecting thyroid and renal function.  Discussed need for periodic lab monitoring to  determine drug level and to assess for potential adverse effects.  Counseled patient regarding signs and symptoms of lithium toxicity and advised that they notify office immediately or seek urgent medical attention if experiencing these signs and symptoms.  Patient advised  to contact office with any questions or concerns.  Emphasized the importance of getting a lithium level before his next appointment.  Follow-up 6 weeks  I connected with patient by a video enabled telemedicine application or telephone, with their informed consent, and verified patient privacy and that I am speaking with the correct person using two identifiers.  I was located at office and patient at home.   Iona Hansen, MD, DFAPA  No future appointments.  Orders Placed This Encounter  Procedures  . Lithium level      -------------------------------

## 2018-07-08 LAB — LITHIUM LEVEL: Lithium Lvl: 0.9 mmol/L (ref 0.6–1.2)

## 2018-07-14 ENCOUNTER — Ambulatory Visit (INDEPENDENT_AMBULATORY_CARE_PROVIDER_SITE_OTHER): Payer: BLUE CROSS/BLUE SHIELD | Admitting: Psychiatry

## 2018-07-14 ENCOUNTER — Encounter: Payer: Self-pay | Admitting: Psychiatry

## 2018-07-14 ENCOUNTER — Other Ambulatory Visit: Payer: Self-pay

## 2018-07-14 DIAGNOSIS — F411 Generalized anxiety disorder: Secondary | ICD-10-CM

## 2018-07-14 DIAGNOSIS — G4721 Circadian rhythm sleep disorder, delayed sleep phase type: Secondary | ICD-10-CM

## 2018-07-14 DIAGNOSIS — F3132 Bipolar disorder, current episode depressed, moderate: Secondary | ICD-10-CM

## 2018-07-14 NOTE — Progress Notes (Addendum)
William Frye [id number] 06/13/1955 63 y.o.   Virtual Visit via Telephone Note  I connected with pt by telephone and verified that I am speaking with the correct person using two identifiers.   I discussed the limitations, risks, security and privacy concerns of performing an evaluation and management service by telephone and the availability of in person appointments. I also discussed with the patient that there may be a patient responsible charge related to this service. The patient expressed understanding and agreed to proceed.  I discussed the assessment and treatment plan with the patient. The patient was provided an opportunity to ask questions and all were answered. The patient agreed with the plan and demonstrated an understanding of the instructions.   The patient was advised to call back or seek an in-person evaluation if the symptoms worsen or if the condition fails to improve as anticipated.  I provided 15 minutes of non-face-to-face time during this encounter. The call started at 1:00 and ended at 115. The patient was located at home and the provider was located office.   Subjective:   Patient ID:  William BillsDavid S Austill is a 63 y.o. (DOB 08/09/1955) male.  Chief Complaint:  Chief Complaint  Patient presents with  . Follow-up    Medication Management  . Depression    Low energy  . Medication Problem    Lithium- causing GI upset    HPI William BillsDavid S Renn presents to the office today for follow-up of bipolar.  Last visit June 02 2018 did his lithium was increased from 1050 to 1200 mg daily.  This was for bipolar depression.  Depression level is about the same and energy is low and little productivity unchanged.  But diarrhea is a lot worse.  Taking Immodium every other day.  Doesn't like that.  Wants to decrease the lithium to 900mg .    Anxiety is under control.  Nothing to do.  Lay around the house.  Weird sleeping patterns.  Gets late night hunger binges on sugar and falls asleep  6 am and then sleeps day away.  Past psychiatric medications include Depakote and Prozac.  Seroquel side effects, buspirone.  Review of Systems:  Review of Systems  Gastrointestinal: Negative for diarrhea.  Neurological: Negative for tremors and weakness.  Psychiatric/Behavioral: Positive for depression.    Medications: I have reviewed the patient's current medications.  Current Outpatient Medications  Medication Sig Dispense Refill  . lithium carbonate 300 MG capsule Take 4 capsules (1,200 mg total) by mouth at bedtime. 120 capsule 1  . loperamide (IMODIUM A-D) 2 MG tablet Take 2 mg by mouth 4 (four) times daily as needed for diarrhea or loose stools.    . Theanine 50 MG TBDP Take 100 mg by mouth daily.     No current facility-administered medications for this visit.     Medication Side Effects: None  Allergies: No Known Allergies  Past Medical History:  Diagnosis Date  . Depression   . Emphysema with chronic bronchitis (HCC)   . Frequent headaches     Family History  Problem Relation Age of Onset  . Hyperlipidemia Mother   . COPD Father   . Cancer Father   . Depression Brother   . Drug abuse Brother   . Early death Brother   . Mental illness Brother   . Depression Sister     Social History   Socioeconomic History  . Marital status: Divorced    Spouse name: Not on file  . Number of  children: Not on file  . Years of education: Not on file  . Highest education level: Not on file  Occupational History  . Not on file  Social Needs  . Financial resource strain: Not on file  . Food insecurity:    Worry: Not on file    Inability: Not on file  . Transportation needs:    Medical: Not on file    Non-medical: Not on file  Tobacco Use  . Smoking status: Current Every Day Smoker    Types: Cigarettes  . Smokeless tobacco: Never Used  Substance and Sexual Activity  . Alcohol use: Not Currently  . Drug use: Never  . Sexual activity: Not Currently    Partners:  Female  Lifestyle  . Physical activity:    Days per week: Not on file    Minutes per session: Not on file  . Stress: Not on file  Relationships  . Social connections:    Talks on phone: Not on file    Gets together: Not on file    Attends religious service: Not on file    Active member of club or organization: Not on file    Attends meetings of clubs or organizations: Not on file    Relationship status: Not on file  . Intimate partner violence:    Fear of current or ex partner: Not on file    Emotionally abused: Not on file    Physically abused: Not on file    Forced sexual activity: Not on file  Other Topics Concern  . Not on file  Social History Narrative  . Not on file    Past Medical History, Surgical history, Social history, and Family history were reviewed and updated as appropriate.   Please see review of systems for further details on the patient's review from today.   Objective:   Physical Exam:  There were no vitals taken for this visit.  Physical Exam Neurological:     Mental Status: He is alert and oriented to person, place, and time.     Cranial Nerves: No dysarthria.  Psychiatric:        Attention and Perception: Attention normal.        Mood and Affect: Mood is depressed. Mood is not anxious.        Speech: Speech normal.        Behavior: Behavior is cooperative.        Thought Content: Thought content normal. Thought content is not paranoid or delusional. Thought content does not include homicidal or suicidal ideation. Thought content does not include homicidal or suicidal plan.        Cognition and Memory: Cognition and memory normal.        Judgment: Judgment normal.     Comments: Insight fair.  No manic symptoms     Lab Review:     Component Value Date/Time   NA 140 02/15/2018 1141   K 4.3 02/15/2018 1141   CL 103 02/15/2018 1141   CO2 28 02/15/2018 1141   GLUCOSE 103 (H) 02/15/2018 1141   BUN 15 02/15/2018 1141   CREATININE 1.05  02/15/2018 1141   CALCIUM 9.5 02/15/2018 1141   PROT 6.7 02/15/2018 1141   ALBUMIN 4.4 02/15/2018 1141   AST 20 02/15/2018 1141   ALT 19 02/15/2018 1141   ALKPHOS 136 (H) 02/15/2018 1141   BILITOT 0.8 02/15/2018 1141       Component Value Date/Time   WBC 6.7 02/15/2018 1141   RBC  5.69 02/15/2018 1141   HGB 17.7 (H) 02/15/2018 1141   HCT 52.8 (H) 02/15/2018 1141   PLT 235.0 02/15/2018 1141   MCV 92.7 02/15/2018 1141   MCHC 33.6 02/15/2018 1141   RDW 14.5 02/15/2018 1141   LYMPHSABS 1.3 02/15/2018 1141   MONOABS 0.5 02/15/2018 1141   EOSABS 0.1 02/15/2018 1141   BASOSABS 0.0 02/15/2018 1141    Lithium Lvl  Date Value Ref Range Status  07/07/2018 0.9 0.6 - 1.2 mmol/L Final     No results found for: PHENYTOIN, PHENOBARB, VALPROATE, CBMZ   .res Assessment: Plan:    Bipolar disorder with moderate depression (HCC)  Generalized anxiety disorder  Delayed sleep phase syndrome  Dae's anxiety has revolved resolved on the lithium.  His bipolar depression has nightly not completely remitted.  He is doing better on lithium than he did with Depakote and Prozac.  He could not tolerate 1200 mg of lithium and it did not improve his depression any further.  Decrease lithium to 300 mg capsules, 3 nightly.  Because of  diarrhea.  Option Wellbutrin for residual depression.  He wants to research it on his own.  It's a low risk of triggering mood swings.  Disc dosing and SE.  Start Wellbutrin generic bupropion XL 150 mg tablet 1 each morning for 1 week, then 2 each morning.  When that RX is gone then start Bupropion XL 300 mg 1 each morning.  Counseled patient regarding potential benefits, risks, and side effects of lithium to include potential risk of lithium affecting thyroid and renal function.  Discussed need for periodic lab monitoring to determine drug level and to assess for potential adverse effects.  Counseled patient regarding signs and symptoms of lithium toxicity and advised that  they notify office immediately or seek urgent medical attention if experiencing these signs and symptoms.  Patient advised to contact office with any questions or concerns.  Recent lithium level 0.9 was good.  BMP including creatinine and calcium are good as well.  Follow-up 8 weeks  Iona Hansen, MD, DFAPA  No future appointments.  No orders of the defined types were placed in this encounter.     -------------------------------

## 2018-07-15 NOTE — Patient Instructions (Signed)
Decrease lithium to 300 mg capsules, 3 nightly.  Because of  diarrhea.  Option Wellbutrin for residual depression.  He wants to research it on his own.  It's a low risk of triggering mood swings.  Disc dosing and SE.   If he agrees, Start Wellbutrin generic bupropion XL 150 mg tablet 1 each morning for 1 week, then 2 each morning.  When that RX is gone then start Bupropion XL 300 mg 1 each morning.

## 2018-08-12 ENCOUNTER — Telehealth: Payer: Self-pay | Admitting: Psychiatry

## 2018-08-12 NOTE — Telephone Encounter (Signed)
Please remind him that I told him he has bipolar disorder and he is likely to have mood swings off of the lithium.  We need to get him in to an appointment as soon as possible to to find an alternative mood stabilizer.  I will inform staff to put him on the cancellation list.

## 2018-08-12 NOTE — Telephone Encounter (Signed)
Patient called and said that he has stopped taking the lithium all together due to the dirahea and pain. Still researching about the wellbutrin and will contact if he wants to start it

## 2018-08-15 NOTE — Telephone Encounter (Signed)
Pt aware and his appt did get moved up to the first of the month and was also put on cxl list.

## 2018-08-26 ENCOUNTER — Encounter: Payer: Self-pay | Admitting: Psychiatry

## 2018-08-26 ENCOUNTER — Ambulatory Visit: Payer: Self-pay | Admitting: Psychiatry

## 2018-08-26 ENCOUNTER — Other Ambulatory Visit: Payer: Self-pay

## 2018-08-26 DIAGNOSIS — F319 Bipolar disorder, unspecified: Secondary | ICD-10-CM

## 2018-08-26 DIAGNOSIS — G4721 Circadian rhythm sleep disorder, delayed sleep phase type: Secondary | ICD-10-CM

## 2018-08-26 DIAGNOSIS — F411 Generalized anxiety disorder: Secondary | ICD-10-CM

## 2018-08-26 MED ORDER — BUSPIRONE HCL 15 MG PO TABS: 15.0000 mg | ORAL_TABLET | Freq: Two times a day (BID) | ORAL | 1 refills | Status: DC

## 2018-08-26 MED ORDER — ARIPIPRAZOLE 15 MG PO TABS: 15.0000 mg | ORAL_TABLET | Freq: Every day | ORAL | 1 refills | Status: DC

## 2018-08-26 NOTE — Progress Notes (Signed)
William BillsDavid S Belkin [id number] 10/14/1955 63 y.o.     Subjective:   Patient ID:  William Frye is a 63 y.o. (DOB 08/16/1955) male.  Chief Complaint:  Chief Complaint  Patient presents with  . Medication Problem    lithium diarrhea  . Drug Problem  . Follow-up    med mangement    Depression       Seen with his sister. William Frye presents to the office today for follow-up of bipolar.   Urgent appt bc he stopped the lithium DT diarrhea.  He started the buspirone.  Nerves worse off the lithium.  More anxious.    Disc need for mood stabilizer.  Last visit June 02 2018 did his lithium was increased from 1050 to 1200 mg daily.  This was for bipolar depression.  Depression level is about the same and energy is low and little productivity unchanged.  But diarrhea is a lot worse.  Taking Immodium every other day.  Doesn't like that.  Wants to decrease the lithium to 900mg .    Sleep varies a lot. 6 hours at a time but a lot of time in bed during the day bc no motivation and controlled by anxiety to not want to face the world.  General anxiety and worry.  Doom but no paranoia.    Anxiety is under control.  Nothing to do.  Lay around the house.  Weird sleeping patterns.  Gets late night hunger binges on sugar and falls asleep 6 am and then sleeps day away.  Past psychiatric medications include Depakote and Prozac NR.  Seroquel side effects, buspirone.  Review of Systems:  Review of Systems  Gastrointestinal: Negative for diarrhea.  Neurological: Negative for tremors and weakness.  Psychiatric/Behavioral: Positive for depression.    Medications: I have reviewed the patient's current medications.  Current Outpatient Medications  Medication Sig Dispense Refill  . Theanine 50 MG TBDP Take 100 mg by mouth daily.    Marland Kitchen. lithium carbonate 300 MG capsule Take 4 capsules (1,200 mg total) by mouth at bedtime. (Patient not taking: Reported on 08/26/2018) 120 capsule 1  . loperamide (IMODIUM A-D)  2 MG tablet Take 2 mg by mouth 4 (four) times daily as needed for diarrhea or loose stools.     No current facility-administered medications for this visit.     Medication Side Effects: None  Allergies: No Known Allergies  Past Medical History:  Diagnosis Date  . Depression   . Emphysema with chronic bronchitis (HCC)   . Frequent headaches     Family History  Problem Relation Age of Onset  . Hyperlipidemia Mother   . COPD Father   . Cancer Father   . Depression Brother   . Drug abuse Brother   . Early death Brother   . Mental illness Brother   . Depression Sister     Social History   Socioeconomic History  . Marital status: Divorced    Spouse name: Not on file  . Number of children: Not on file  . Years of education: Not on file  . Highest education level: Not on file  Occupational History  . Not on file  Social Needs  . Financial resource strain: Not on file  . Food insecurity    Worry: Not on file    Inability: Not on file  . Transportation needs    Medical: Not on file    Non-medical: Not on file  Tobacco Use  . Smoking status: Current Every  Day Smoker    Types: Cigarettes  . Smokeless tobacco: Never Used  Substance and Sexual Activity  . Alcohol use: Not Currently  . Drug use: Never  . Sexual activity: Not Currently    Partners: Female  Lifestyle  . Physical activity    Days per week: Not on file    Minutes per session: Not on file  . Stress: Not on file  Relationships  . Social Musicianconnections    Talks on phone: Not on file    Gets together: Not on file    Attends religious service: Not on file    Active member of club or organization: Not on file    Attends meetings of clubs or organizations: Not on file    Relationship status: Not on file  . Intimate partner violence    Fear of current or ex partner: Not on file    Emotionally abused: Not on file    Physically abused: Not on file    Forced sexual activity: Not on file  Other Topics Concern   . Not on file  Social History Narrative  . Not on file    Past Medical History, Surgical history, Social history, and Family history were reviewed and updated as appropriate.   Please see review of systems for further details on the patient's review from today.   Objective:   Physical Exam:  There were no vitals taken for this visit.  Physical Exam Constitutional:      General: He is not in acute distress.    Appearance: He is well-developed. He is obese.  Musculoskeletal:        General: No deformity.  Neurological:     Mental Status: He is alert and oriented to person, place, and time.     Coordination: Coordination normal.  Psychiatric:        Attention and Perception: He is attentive. He does not perceive auditory hallucinations.        Mood and Affect: Mood is anxious and depressed. Affect is not labile, blunt, angry or inappropriate.        Speech: Speech normal.        Behavior: Behavior normal.        Thought Content: Thought content normal. Thought content does not include homicidal or suicidal ideation. Thought content does not include homicidal or suicidal plan.        Cognition and Memory: Cognition normal.        Judgment: Judgment is impulsive.     Comments: Insight fair and tends to be impulsive.  Colluding stopping meds abruptly on his own. No auditory or visual hallucinations. No delusions.      Lab Review:     Component Value Date/Time   NA 140 02/15/2018 1141   K 4.3 02/15/2018 1141   CL 103 02/15/2018 1141   CO2 28 02/15/2018 1141   GLUCOSE 103 (H) 02/15/2018 1141   BUN 15 02/15/2018 1141   CREATININE 1.05 02/15/2018 1141   CALCIUM 9.5 02/15/2018 1141   PROT 6.7 02/15/2018 1141   ALBUMIN 4.4 02/15/2018 1141   AST 20 02/15/2018 1141   ALT 19 02/15/2018 1141   ALKPHOS 136 (H) 02/15/2018 1141   BILITOT 0.8 02/15/2018 1141       Component Value Date/Time   WBC 6.7 02/15/2018 1141   RBC 5.69 02/15/2018 1141   HGB 17.7 (H) 02/15/2018 1141    HCT 52.8 (H) 02/15/2018 1141   PLT 235.0 02/15/2018 1141   MCV 92.7 02/15/2018  1141   MCHC 33.6 02/15/2018 1141   RDW 14.5 02/15/2018 1141   LYMPHSABS 1.3 02/15/2018 1141   MONOABS 0.5 02/15/2018 1141   EOSABS 0.1 02/15/2018 1141   BASOSABS 0.0 02/15/2018 1141    Lithium Lvl  Date Value Ref Range Status  07/07/2018 0.9 0.6 - 1.2 mmol/L Final     No results found for: PHENYTOIN, PHENOBARB, VALPROATE, CBMZ   .res Assessment: Plan:    Maalik was seen today for medication problem, drug problem and follow-up.  Diagnoses and all orders for this visit:  Bipolar I disorder ([place]) -     ARIPiprazole (ABILIFY) 15 MG tablet; Take 1 tablet (15 mg total) by mouth daily. -     busPIRone (BUSPAR) 15 MG tablet; Take 1 tablet (15 mg total) by mouth 2 (two) times daily.  Generalized anxiety disorder  Delayed sleep phase syndrome   Panagiotis's anxiety had revolved resolved on the lithium.  However he could not tolerate the lithium due to excessive diarrhea and abdominal pain.  He stopped the lithium and has had a relapse of anxiety.  Therefore we have to choose an alternative mood stabilizer.  Discussed this at length including the 2 major classes of mood stabilizers including the classic ones lithium Depakote and carbamazepine and then the class of atypical antipsychotics.  We discussed the side effect differences in these classes as well as cost issues between the classes.  Options for mood stabilizer: CBZ, Abilify, Seroquel, need to be cost effective. Disc SE incl TD, DM, EPS, sedation.  Abilify  15 mg daily is good cost effective mood stabilizer.  He will continue the buspirone for now but if he gets a very good effect out of Abilify we will discontinue it later.  Discussed the need to adjust the dosing on Abilify if he has side effects then please let us know who.  Follow-up for weeks  Hiram Comber, MD, DFAPA  Future Appointments  Date Time Provider [place]  09/01/2018  9:30 AM Cottle,  Billey Co., MD [facility] None    No orders of the defined types were placed in this encounter.     -------------------------------

## 2018-08-26 NOTE — Patient Instructions (Signed)
Abilify 15 daily as mood stabilizer and help anxiety.

## 2018-09-01 ENCOUNTER — Encounter

## 2018-09-01 ENCOUNTER — Ambulatory Visit: Payer: Self-pay | Admitting: Psychiatry

## 2018-09-05 ENCOUNTER — Telehealth: Payer: Self-pay | Admitting: Psychiatry

## 2018-09-05 ENCOUNTER — Other Ambulatory Visit: Payer: Self-pay

## 2018-09-05 DIAGNOSIS — F319 Bipolar disorder, unspecified: Secondary | ICD-10-CM

## 2018-09-05 MED ORDER — ARIPIPRAZOLE 15 MG PO TABS: 22.5000 mg | ORAL_TABLET | Freq: Every day | ORAL | 1 refills | Status: DC

## 2018-09-05 NOTE — Telephone Encounter (Signed)
Spoke with [name] and explained the recommendation to increase Abilify 15 mg 1.5 tablets daily. She said he's still in a depressive state no mania at all. Instructed to follow up at the end of the week if no improvements. She said he's never had this symptom so they were concerned. Appreciative of the call. She also mentioned he would really not want to go back on lithium with his GI side effects.

## 2018-09-05 NOTE — Telephone Encounter (Signed)
[name]'s sister, [name], called to report that William Frye is experiencing auditory hallucinations.  Talking with people that aren't there.  A new med was added at last visit.  Is that a side effect?  Knows [name] is on vacation, but would like some advice on what to do about this.  Next appt 10/24/18

## 2018-09-05 NOTE — Telephone Encounter (Signed)
He recently stopped lithium DT SE and added Abilify.  The solution is to raise the Abilify to an antipsychotic level.  Increase to 15 mg 1 1/2 tablets daily.  May take a few days to help.  If he's also real manic he may need hospitalization.  Please make sure there's no dangerousness.  Thank you.  Let me know if more input is needed. It would help if he went back on a lower dose of lithium than the 1200 he didn't tolerate, like about 600 mg .  Hed get better faster if he did, but it's not essential.

## 2018-09-07 ENCOUNTER — Telehealth: Payer: Self-pay | Admitting: Psychiatry

## 2018-09-07 NOTE — Telephone Encounter (Signed)
I agree, it's usually given in the morning.  But it is okay for him to split it up like that, if he sleeps okay.

## 2018-09-07 NOTE — Telephone Encounter (Signed)
Spoke with [name] about situation and I recommended all in the morning after the conversation. Instructed to call back with any other concerns.

## 2018-09-07 NOTE — Telephone Encounter (Signed)
[name], [name], called about the increase of the Abilify to 1.5 tabs/day.  She said [name] is wanting to take it as 1/2 tab in AM and the full tab in the pm when he takes his buspar.  Is this correct or should he be taking the full 1.5 tabs all at once.  She wants to be sure he takes it correctly.  Please call  [name]'s # is [phone]

## 2018-09-12 ENCOUNTER — Other Ambulatory Visit: Payer: Self-pay | Admitting: Psychiatry

## 2018-09-12 ENCOUNTER — Telehealth: Payer: Self-pay | Admitting: Psychiatry

## 2018-09-12 DIAGNOSIS — F319 Bipolar disorder, unspecified: Secondary | ICD-10-CM

## 2018-09-12 DIAGNOSIS — Z79899 Other long term (current) drug therapy: Secondary | ICD-10-CM

## 2018-09-12 NOTE — Telephone Encounter (Signed)
Pt needs an antipsychotic DT hallucinations and aripiprazole is not helplping.  He cannot afford brand.  At least for the short term next best option is risperidone.  DC Abilify and start Risperidone 61m g  HS.  /bc increased hunger suggest check BMP preferably fasting.  I ordered those but not risperidone.

## 2018-09-12 NOTE — Telephone Encounter (Signed)
Also requesting an earlier or appt or to be put on cxl list

## 2018-09-12 NOTE — Telephone Encounter (Signed)
Patient's sister called and said that William Frye is still having hallucinations visual and audiale. He is seeing people and having reoccurring dreams. He is disoriented to the time of day. Ate lasagna at 8 am thought it was dinner time.He has an enormous appetite and is eatiung every two hours and he is already overweight. He is sleeping all the time and always tired.The sister wants to know is the hallucinations he is having caused by the medicine and will this pass once the medicine is in his system or out of of his system . Also wants to know about his eating every 2 hours is that also caused by medicines? Please call her at [phone]. His next appointment is 8/24

## 2018-09-13 ENCOUNTER — Other Ambulatory Visit: Payer: Self-pay

## 2018-09-13 MED ORDER — RISPERIDONE 3 MG PO TABS: 3.0000 mg | ORAL_TABLET | Freq: Every day | ORAL | 1 refills | Status: DC

## 2018-09-13 NOTE — Telephone Encounter (Signed)
Sister called back today and they do want to proceed with Risperidone 3 mg instead of Seroquel, her brother mentioned he's tried that before and had to go off within a few days. Will submit rx to [facility] in [facility] [phone]

## 2018-09-16 ENCOUNTER — Telehealth: Payer: Self-pay | Admitting: Psychiatry

## 2018-09-16 ENCOUNTER — Other Ambulatory Visit: Payer: Self-pay

## 2018-09-16 MED ORDER — QUETIAPINE FUMARATE 100 MG PO TABS: ORAL_TABLET | ORAL | 0 refills | Status: DC

## 2018-09-16 NOTE — Telephone Encounter (Signed)
He will remain psychotic as long as he's not sleeping.  He could just try a lower dose 50-100 mg Seroquel just for sleep and give the risperidone more time to help.  It will work better if he's sleeping 6-7 hours daily.  The low dose is unlikely to cause significant SE other than a little hangover initially.

## 2018-09-16 NOTE — Telephone Encounter (Signed)
He's been off Abilify for several days and hallucinating and not sleeping.  Has not responded so far on 4 days of risperidone at what should be an adequate dosage.  But it may not be adequate.  They expressed interest in Seroquel but it is difficult to get a quick antipsychotic dosage of that which is likely to be 400-600 mg daily and pt is not likely to tolerate that immediately.  A high risk of orthostatic hypotension, falling and excessive sedation.  We are entering a weekend and I will be travelling and not available.  We do have on call staff but they will not be familiar with his difficulty and history.  My primary suggestion is hospitalization bc it's the safest and quickest path to resolution of sx.   he's psychotic and stabilization could occur much quicker there than on outpatient basis..  If they want to try an alternative then he can start 100 mg quetiapine nightly with risperidone and increase every 2 days as tolerated by 100 mg until he reaches a target dose of 400 mg hs.  At that point if he's sleeping and not hallucinating he can abruptly stop risperidone.  I would strongly suggest he not be left alone until he's sleeping and no longer hallucinating.  Lynder Parents, MD, DFAPA

## 2018-09-16 NOTE — Telephone Encounter (Signed)
Pt's sister, [name], called ( signed DPR), to report that patient is "bad off".  He has not slept in 3-4 nights, disoriented to time and date, getting up and making dinner in the am,  Seeing and hearing people come into his room - but no one is there., is having very strange behavior.  The family has never seen him like this, but he dx w/bipolar disorder. Please advise urgently. Has only been on new med Risperdal for about 4 days.

## 2018-09-16 NOTE — Telephone Encounter (Signed)
Sister called back and said that William Frye is exhausted  And has no energy. Just wants to be in the bed al day.

## 2018-09-16 NOTE — Telephone Encounter (Signed)
Spoke to Tracy again at length, instructed on the lower dose of Seroquel and increasing it if not helping him sleep. Family has a lot of questions, I tried to answer and reassure them. The reason they didn't take him to hospital is because with Covid they would have to just drop him off and they are not comfortable with that and would not be able to visit until discharged. Instructed them to go to ER if things got worse over the weekend, or contact our provider on call with additional questions.   Will send in rx for quetiapine.

## 2018-09-19 ENCOUNTER — Telehealth: Payer: Self-pay | Admitting: Psychiatry

## 2018-09-19 NOTE — Telephone Encounter (Signed)
Pt sister [name] called. Stated took Pt to hospital [facility] this weekend. Ask if Traci (nurse) will return call @ [phone]

## 2018-09-20 NOTE — Telephone Encounter (Signed)
Provided clinical history and specifically medication changes and dates and dosages since the patient was first seen here in January.  He is an inpatient there and they will make decisions about his next choice of bipolar disorder mood stabilizer.

## 2018-09-21 ENCOUNTER — Telehealth: Payer: Self-pay | Admitting: Psychiatry

## 2018-09-21 NOTE — Telephone Encounter (Signed)
Spoke with sister [name], she was asking what does lithium Quintavius had been on. Instructed her he had been on 1200 mg/day but asking to decrease to 900 mg. Sister reported at some point he just stopped taking it completely after last visit on 08/26/2018

## 2018-09-21 NOTE — Telephone Encounter (Signed)
DOLE PEYTON STATED HE CALLED ON YESTERDAY CONCERNING Lafe Utke. PLEASE CALL DR LEE FOR AN UPDATE ON TODAY. #[phone]

## 2018-09-21 NOTE — Telephone Encounter (Signed)
Called the # listed and receptionist took a message, she wasn't sure what the caller was needing. She would pass the message along.

## 2018-09-22 ENCOUNTER — Telehealth: Payer: Self-pay | Admitting: Psychiatry

## 2018-09-22 ENCOUNTER — Ambulatory Visit: Payer: BLUE CROSS/BLUE SHIELD | Admitting: Psychiatry

## 2018-09-22 NOTE — Telephone Encounter (Signed)
Telephone call from Dr. Truman Hayward resident and psychiatry at [facility] inpatient psychiatric unit.  Patient's psychosis has resolved on Risperdal 3 mg daily.  They anticipate discharge tomorrow on that same medication and no other psych meds at this time.

## 2018-10-24 ENCOUNTER — Ambulatory Visit (INDEPENDENT_AMBULATORY_CARE_PROVIDER_SITE_OTHER): Payer: Self-pay | Admitting: Psychiatry

## 2018-10-24 ENCOUNTER — Other Ambulatory Visit: Payer: Self-pay

## 2018-10-24 ENCOUNTER — Encounter: Payer: Self-pay | Admitting: Psychiatry

## 2018-10-24 DIAGNOSIS — G4721 Circadian rhythm sleep disorder, delayed sleep phase type: Secondary | ICD-10-CM

## 2018-10-24 DIAGNOSIS — F28 Other psychotic disorder not due to a substance or known physiological condition: Secondary | ICD-10-CM

## 2018-10-24 DIAGNOSIS — F411 Generalized anxiety disorder: Secondary | ICD-10-CM

## 2018-10-24 DIAGNOSIS — F319 Bipolar disorder, unspecified: Secondary | ICD-10-CM

## 2018-10-24 MED ORDER — RISPERIDONE 1 MG PO TABS: 2.0000 mg | ORAL_TABLET | Freq: Every day | ORAL | 1 refills | Status: DC

## 2018-10-24 NOTE — Progress Notes (Signed)
William BillsDavid S Frye [id number] 09/22/1955 63 y.o.     Subjective:   Patient ID:  William Frye is a 63 y.o. (DOB 12/26/1955) male.  Chief Complaint:  Chief Complaint  Patient presents with  . Follow-up    Recent psychiatric hospitalization  . Medication Reaction    Depression        Associated symptoms include fatigue. Seen with his sister. William Frye presents to the office today for follow-up of bipolar.   Last visit was August 25, 2018.  He had stopped lithium on his own because of complaints of diarrhea.  We had to select an alternative mood stabilizer and picked Abilify 15 mg daily. He is called multiple times since that visit.  Or else his sister has called for him.  On July 6 that his sister called stating he was having new onset auditory hallucinations.  It was suggested that he increase Abilify to get a better antipsychotic effect at 22.5 mg daily.  They called back again on July 13 stating that he was still having auditory and visual hallucinations.  Abilify was discontinued and recommendation was to start risperidone 3 mg nightly.  His sister called again on July 17 stating that he was not sleeping and still hallucinating and was disoriented.  At that point hospitalization was recommended.  That day he was [facility] and continued on risperidone.  He was discharged on risperidone 3 mg nightly quetiapine 100 mg nightly for sleep and buspirone 15 mg twice daily.  He was discharged July 23.  The psychiatric resident there Dr. Nedra HaiLee indicated his psychosis had improved.  Wants to stop all psych meds.  CO SE risperidone "zombie".  Last hallucinations in hospital.  Per D dramatically better after the hospitalization.  Patient reports stable mood and denies  irritable moods.  Generally still depressed and anxious but not severe.  Patient denies difficulty with sleep initiation or maintenance. At least 8 hours. Denies appetite  disturbance.  Patient reports that energy and motivation have been low.  Patient has easy difficulty with concentration.  Patient denies any suicidal ideation.  A struggle to make himself walk.  Staying with Dianne.   She sees him as flat.    Past psychiatric medications include Depakote and Prozac NR.  Seroquel side effects, buspirone. Abilify worse..  Lithium diarrhea.  Review of Systems:  Review of Systems  Constitutional: Positive for fatigue.  Gastrointestinal: Negative for diarrhea.  Neurological: Negative for tremors and weakness.  Psychiatric/Behavioral: Positive for depression. Negative for agitation, behavioral problems, confusion and hallucinations.    Medications: I have reviewed the patient's current medications.  Current Outpatient Medications  Medication Sig Dispense Refill  . lithium carbonate 300 MG capsule Take 4 capsules (1,200 mg total) by mouth at bedtime. (Patient not taking: Reported on 08/26/2018) 120 capsule 1  . loperamide (IMODIUM A-D) 2 MG tablet Take 2 mg by mouth 4 (four) times daily as needed for diarrhea or loose stools.    . risperiDONE (RISPERDAL) 1 MG tablet Take 2 tablets (2 mg total) by mouth at bedtime. 60 tablet 1  . Theanine 50 MG TBDP Take 100 mg by mouth daily.     No current facility-administered medications for this visit.     Medication Side Effects: None  Allergies: No Known Allergies  Past Medical History:  Diagnosis Date  . Depression   . Emphysema with chronic bronchitis (HCC)   . Frequent headaches     Family  History  Problem Relation Age of Onset  . Hyperlipidemia Mother   . COPD Father   . Cancer Father   . Depression Brother   . Drug abuse Brother   . Early death Brother   . Mental illness Brother   . Depression Sister     Social History   Socioeconomic History  . Marital status: Divorced    Spouse name: Not on file  . Number of children: Not on file  . Years of education: Not on file  . Highest education  level: Not on file  Occupational History  . Not on file  Social Needs  . Financial resource strain: Not on file  . Food insecurity    Worry: Not on file    Inability: Not on file  . Transportation needs    Medical: Not on file    Non-medical: Not on file  Tobacco Use  . Smoking status: Current Every Day Smoker    Types: Cigarettes  . Smokeless tobacco: Never Used  Substance and Sexual Activity  . Alcohol use: Not Currently  . Drug use: Never  . Sexual activity: Not Currently    Partners: Female  Lifestyle  . Physical activity    Days per week: Not on file    Minutes per session: Not on file  . Stress: Not on file  Relationships  . Social Herbalist on phone: Not on file    Gets together: Not on file    Attends religious service: Not on file    Active member of club or organization: Not on file    Attends meetings of clubs or organizations: Not on file    Relationship status: Not on file  . Intimate partner violence    Fear of current or ex partner: Not on file    Emotionally abused: Not on file    Physically abused: Not on file    Forced sexual activity: Not on file  Other Topics Concern  . Not on file  Social History Narrative  . Not on file    Past Medical History, Surgical history, Social history, and Family history were reviewed and updated as appropriate.   Please see review of systems for further details on the patient's review from today.   Objective:   Physical Exam:  There were no vitals taken for this visit.  Physical Exam Constitutional:      General: He is not in acute distress.    Appearance: Normal appearance. He is well-developed. He is obese.  Musculoskeletal:        General: No deformity.  Neurological:     Mental Status: He is alert and oriented to person, place, and time.     Coordination: Coordination normal.  Psychiatric:        Attention and Perception: He is attentive. He does not perceive auditory hallucinations.         Mood and Affect: Mood is anxious and depressed. Affect is not labile, blunt, angry or inappropriate.        Speech: Speech normal.        Behavior: Behavior normal.        Thought Content: Thought content normal. Thought content does not include homicidal or suicidal ideation. Thought content does not include homicidal or suicidal plan.        Cognition and Memory: Cognition normal.        Judgment: Judgment is not impulsive.     Comments: Insight fair . No auditory  or visual hallucinations. No delusions. He is not markedly depressed nor anxious.      Lab Review:     Component Value Date/Time   NA 140 02/15/2018 1141   K 4.3 02/15/2018 1141   CL 103 02/15/2018 1141   CO2 28 02/15/2018 1141   GLUCOSE 103 (H) 02/15/2018 1141   BUN 15 02/15/2018 1141   CREATININE 1.05 02/15/2018 1141   CALCIUM 9.5 02/15/2018 1141   PROT 6.7 02/15/2018 1141   ALBUMIN 4.4 02/15/2018 1141   AST 20 02/15/2018 1141   ALT 19 02/15/2018 1141   ALKPHOS 136 (H) 02/15/2018 1141   BILITOT 0.8 02/15/2018 1141       Component Value Date/Time   WBC 6.7 02/15/2018 1141   RBC 5.69 02/15/2018 1141   HGB 17.7 (H) 02/15/2018 1141   HCT 52.8 (H) 02/15/2018 1141   PLT 235.0 02/15/2018 1141   MCV 92.7 02/15/2018 1141   MCHC 33.6 02/15/2018 1141   RDW 14.5 02/15/2018 1141   LYMPHSABS 1.3 02/15/2018 1141   MONOABS 0.5 02/15/2018 1141   EOSABS 0.1 02/15/2018 1141   BASOSABS 0.0 02/15/2018 1141    Lithium Lvl  Date Value Ref Range Status  07/07/2018 0.9 0.6 - 1.2 mmol/L Final     No results found for: PHENYTOIN, PHENOBARB, VALPROATE, CBMZ   .res Assessment: Plan:    William Frye was seen today for follow-up and medication reaction.  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  Bipolar I disorder (HCC) -     risperiDONE (RISPERDAL) 1  MG tablet; Take 2 tablets (2 mg total) by mouth at bedtime.  Generalized anxiety disorder  Delayed sleep phase syndrome  William Frye has had an unusual pattern of psychiatric symptoms over the last 6 weeks.  He had been reasonably stable on lithium with regard to his mood disorder but abruptly stopped it due to complaints about diarrhea.  At his follow-up visit in June we had discussion about alternative mood stabilizers.  Abilify 15 mg was prescribed.  Subsequently he became very psychotic without obvious mania.  The psychosis required hospitalization.  It resolved with 3 mg of risperidone.  He now complains of cognitive and physical slowing symptoms related to risperidone.  He feels too flat.  Wants to stop all medications.  Strongly discouraged abruptly stopping this medication after a recent psychiatric hospitalization with psychosis.  High risk of recurrence of psychosis or bipolar symptoms.  Discussed alternative mood stabilizers.  Discussed his diagnosis again that he is bipolar and needs long-term mood stabilization.  He is fearful of changing medications because of what he believes was an adverse reaction to Abilify.  I propose the alternative of a small reduction in risperidone dose to reduce the side effect now that his psychosis has resolved.  We discussed the risk that his psychosis could recur even with reduction in risperidone.  He still prefers this option over the alternative of switching medications.  William Frye has residual depression and anxiety but we will not attempt further medication changes at this time.  Options for mood stabilizer: CBZ, other atypicals.   need to be cost effective. Disc SE incl TD, DM, EPS, sedation.    No further partial DA agonist.  Because of the potential adverse reaction to Abilify we will avoid the use of other partial dopamine  agonist such as Vraylar or Rexulti.  Reduce Risperidone per his request to 2 mg daily DT SE.    He agrees to continue risperidone  but reduce the dose.    This was a 35-minute appointment discussing his diagnosis and other issues as noted above including the treatment plan.  Follow-up 2 weeks  Iona Hansenarey, MD, DFAPA  Future Appointments  Date Time Provider Department Center  11/04/2018  1:30 PM Cottle, Steva Readyarey G Jr., MD [facility] None    No orders of the defined types were placed in this encounter.     -------------------------------

## 2018-11-04 ENCOUNTER — Other Ambulatory Visit: Payer: Self-pay

## 2018-11-04 ENCOUNTER — Ambulatory Visit (INDEPENDENT_AMBULATORY_CARE_PROVIDER_SITE_OTHER): Payer: Self-pay | Admitting: Psychiatry

## 2018-11-04 ENCOUNTER — Encounter: Payer: Self-pay | Admitting: Psychiatry

## 2018-11-04 DIAGNOSIS — F28 Other psychotic disorder not due to a substance or known physiological condition: Secondary | ICD-10-CM

## 2018-11-04 DIAGNOSIS — F319 Bipolar disorder, unspecified: Secondary | ICD-10-CM

## 2018-11-04 DIAGNOSIS — F411 Generalized anxiety disorder: Secondary | ICD-10-CM

## 2018-11-04 MED ORDER — RISPERIDONE 1 MG PO TABS: 1.0000 mg | ORAL_TABLET | Freq: Every day | ORAL | 0 refills | Status: DC

## 2018-11-04 MED ORDER — OXCARBAZEPINE 300 MG PO TABS: ORAL_TABLET | ORAL | 1 refills | Status: DC

## 2018-11-04 NOTE — Patient Instructions (Signed)
After [name] returns, Reduce risperidone to 1 mg at night and start oxcarbazepine (Trileptal) 1/2 twice daily for 1 week, Then 1 twice daily.

## 2018-11-04 NOTE — Progress Notes (Signed)
William HAPP [id number] [birth year] 63 y.o.     Subjective:   Patient ID:  William Frye is a 63 y.o. (DOB Oct 31, 1955) male.  Chief Complaint:  Chief Complaint  Patient presents with  . Follow-up    Medication Management and recent psychosis  . Depression    Medication Management  . Other    Bipolar 2  . Medication Refill    Trazodone    Depression        Associated symptoms include fatigue. Medication Refill Associated symptoms include fatigue. Pertinent negatives include no weakness.  Seen with his sister, Graciella Belton. William Frye presents to the office today for follow-up of bipolar.   At visit was August 25, 2018.  He had stopped lithium on his own because of complaints of diarrhea.  We had to select an alternative mood stabilizer and picked Abilify 15 mg daily. He is called multiple times since that visit.  Or else his sister has called for him.  On July 6 that his sister called stating he was having new onset auditory hallucinations.  It was suggested that he increase Abilify to get a better antipsychotic effect at 22.5 mg daily.  They called back again on July 13 stating that he was still having auditory and visual hallucinations.  Abilify was discontinued and recommendation was to start risperidone 3 mg nightly.  His sister called again on July 17 stating that he was not sleeping and still hallucinating and was disoriented.  At that point hospitalization was recommended.  That day he was admitted to [facility] psychiatric unit and continued on risperidone.  He was discharged on risperidone 3 mg nightly quetiapine 100 mg nightly for sleep and buspirone 15 mg twice daily.  He was discharged July 23.  The psychiatric resident there Dr. Nedra Hai indicated his psychosis had improved.  At his last visit August 24 he was very frustrated with risperidone feeling like a "zombie" and wanted to stop all medications.  He was strongly encouraged not to do so because  of the recent psychosis.  He agreed to a dosage reduction from 3 mg to 2 mg daily to reduce side effects.  Slight change in SE.  Still feel like a zombie.  But more anxiety.  Tired and lifeless but bubbling over with nervousness.  Feels like body vibrating but not consistent and irregular pattern.  Also worry.  Worry over life situation.  Pessimistic about the future.  Sleep was good awhile and he tried to stop it but finds he needs trazodone some.  Average at least 6 hours.  His mother says he spends all day in bed.  He says that's not true.  Never had much in between either full on or off.  Still wants to stop all meds for a while.  Wants to prove to himself that the med is wrong.      Last hallucinations in hospital.  Per D dramatically better after the hospitalization.  Patient reports stable mood and denies  irritable moods.  Generally still depressed and anxious but not severe.  Patient denies difficulty with sleep initiation or maintenance. At least 8 hours. Denies appetite disturbance.  Patient reports that energy and motivation have been low.  Patient has easy difficulty with concentration.  Patient denies any suicidal ideation.  A struggle to make himself walk.  Staying with Dianne.   She sees him as flat.    Past psychiatric medications include Depakote and Prozac NR.  Seroquel side  effects, buspirone. Abilify worse..  Lithium diarrhea. Risperidone 3.   Review of Systems:  Review of Systems  Constitutional: Positive for fatigue.  Gastrointestinal: Negative for diarrhea.  Neurological: Negative for tremors and weakness.  Psychiatric/Behavioral: Positive for depression. Negative for agitation, behavioral problems, confusion and hallucinations.    Medications: I have reviewed the patient's current medications.  Current Outpatient Medications  Medication Sig Dispense Refill  . risperiDONE (RISPERDAL) 1 MG tablet Take 1 tablet (1 mg total) by mouth at bedtime. 30 tablet 0  . traZODone  (DESYREL) 50 MG tablet Take 50 mg by mouth at bedtime.    . Oxcarbazepine (TRILEPTAL) 300 MG tablet 1/2 tablet twice daily for 1 week, then 1 twice daily. 60 tablet 1   No current facility-administered medications for this visit.     Medication Side Effects: None  Allergies:  Allergies  Allergen Reactions  . Lithium Diarrhea and Nausea Only  . Seroquel [Quetiapine Fumarate]     Psychosis  . Aripiprazole Other (See Comments)    Auditory and visual hallucinations    Past Medical History:  Diagnosis Date  . Depression   . Emphysema with chronic bronchitis (HCC)   . Frequent headaches     Family History  Problem Relation Age of Onset  . Hyperlipidemia Mother   . COPD Father   . Cancer Father   . Depression Brother   . Drug abuse Brother   . Early death Brother   . Mental illness Brother   . Depression Sister     Social History   Socioeconomic History  . Marital status: Divorced    Spouse name: Not on file  . Number of children: Not on file  . Years of education: Not on file  . Highest education level: Not on file  Occupational History  . Not on file  Social Needs  . Financial resource strain: Not on file  . Food insecurity    Worry: Not on file    Inability: Not on file  . Transportation needs    Medical: Not on file    Non-medical: Not on file  Tobacco Use  . Smoking status: Current Every Day Smoker    Types: Cigarettes  . Smokeless tobacco: Never Used  Substance and Sexual Activity  . Alcohol use: Not Currently  . Drug use: Never  . Sexual activity: Not Currently    Partners: Female  Lifestyle  . Physical activity    Days per week: Not on file    Minutes per session: Not on file  . Stress: Not on file  Relationships  . Social Musicianconnections    Talks on phone: Not on file    Gets together: Not on file    Attends religious service: Not on file    Active member of club or organization: Not on file    Attends meetings of clubs or organizations: Not on  file    Relationship status: Not on file  . Intimate partner violence    Fear of current or ex partner: Not on file    Emotionally abused: Not on file    Physically abused: Not on file    Forced sexual activity: Not on file  Other Topics Concern  . Not on file  Social History Narrative  . Not on file    Past Medical History, Surgical history, Social history, and Family history were reviewed and updated as appropriate.   Please see review of systems for further details on the patient's review from today.  Objective:   Physical Exam:  There were no vitals taken for this visit.  Physical Exam Constitutional:      General: He is not in acute distress.    Appearance: Normal appearance. He is well-developed. He is obese.  Musculoskeletal:        General: No deformity.  Neurological:     Mental Status: He is alert and oriented to person, place, and time.     Coordination: Coordination normal.  Psychiatric:        Attention and Perception: He is attentive. He does not perceive auditory hallucinations.        Mood and Affect: Mood is anxious and depressed. Affect is not labile, blunt, angry or inappropriate.        Speech: Speech normal.        Behavior: Behavior normal.        Thought Content: Thought content normal. Thought content does not include homicidal or suicidal ideation. Thought content does not include homicidal or suicidal plan.        Cognition and Memory: Cognition normal.        Judgment: Judgment is not impulsive.     Comments: Insight fair . No auditory or visual hallucinations. No delusions. He is not markedly depressed nor anxious.      Lab Review:     Component Value Date/Time   NA 140 02/15/2018 1141   K 4.3 02/15/2018 1141   CL 103 02/15/2018 1141   CO2 28 02/15/2018 1141   GLUCOSE 103 (H) 02/15/2018 1141   BUN 15 02/15/2018 1141   CREATININE 1.05 02/15/2018 1141   CALCIUM 9.5 02/15/2018 1141   PROT 6.7 02/15/2018 1141   ALBUMIN 4.4 02/15/2018  1141   AST 20 02/15/2018 1141   ALT 19 02/15/2018 1141   ALKPHOS 136 (H) 02/15/2018 1141   BILITOT 0.8 02/15/2018 1141       Component Value Date/Time   WBC 6.7 02/15/2018 1141   RBC 5.69 02/15/2018 1141   HGB 17.7 (H) 02/15/2018 1141   HCT 52.8 (H) 02/15/2018 1141   PLT 235.0 02/15/2018 1141   MCV 92.7 02/15/2018 1141   MCHC 33.6 02/15/2018 1141   RDW 14.5 02/15/2018 1141   LYMPHSABS 1.3 02/15/2018 1141   MONOABS 0.5 02/15/2018 1141   EOSABS 0.1 02/15/2018 1141   BASOSABS 0.0 02/15/2018 1141    Lithium Lvl  Date Value Ref Range Status  07/07/2018 0.9 0.6 - 1.2 mmol/L Final     No results found for: PHENYTOIN, PHENOBARB, VALPROATE, CBMZ   .res Assessment: Plan:    Durward was seen today for follow-up, depression, other and medication refill.  Diagnoses and all orders for this visit:  Bipolar I disorder ([place]) -     risperiDONE (RISPERDAL) 1 MG tablet; Take 1 tablet (1 mg total) by mouth at bedtime. -     Oxcarbazepine (TRILEPTAL) 300 MG tablet; 1/2 tablet twice daily for 1 week, then 1 twice daily.  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  Generalized anxiety disorder    Orland has had an unusual pattern of psychiatric symptoms over the last 6 weeks.  He had been reasonably stable on lithium with regard to his mood disorder but abruptly stopped it due to complaints about diarrhea.  At his follow-up visit in June we had discussion about alternative mood stabilizers.  Abilify  15 mg was prescribed.  Subsequently he became very psychotic without obvious mania.  The psychosis required hospitalization.  It resolved with 3 mg of risperidone.  Still complaining of of cognitive and physical slowing symptoms related to risperidone.  He feels too flat.  Wants to stop all medications.  Strongly discouraged abruptly stopping this medication after a  recent psychiatric hospitalization with psychosis.  High risk of recurrence of psychosis or bipolar symptoms.  Discussed alternative mood stabilizers.  Discussed his diagnosis again that he is bipolar and needs long-term mood stabilization.  He is fearful of changing medications because of what he believes was an adverse reaction to Abilify.  I propose the alternative of a small reduction in risperidone dose to reduce the side effect now that his psychosis has resolved.  We can also consider switching to an alternative mood stabilizer since reducing from 3 mg to 2 mg did not significantly help his sense of flatness.  We discussed the risk that his psychosis could recur even with reduction in risperidone, but his anxiety is worse.Onalee Hua.    Bruin has residual depression and anxiety but we will not attempt further medication changes at this time.  Options for mood stabilizer: CBZ, Trileptal other atypicals.   need to be cost effective. Disc SE incl TD, DM, EPS, sedation.  Discussed Trileptal is off label and is often not as effective as the other more traditional mood stabilizers but it is milder and it is cheaper.  Cost is a concern.  No further partial DA agonist.  Because of the potential adverse reaction to Abilify we will avoid the use of other partial dopamine agonist such as Vraylar or Rexulti.  After [name] returns from trip, Reduce risperidone to 1 mg at night and start oxcarbazepine (Trileptal) 1/2 twice daily for 1 week, Then 1 twice daily. Discussed the side effects of Trileptal including the rare risk of Stevens-Johnson syndrome and hyponatremia as well as the more typical side effects of sleepiness and dizziness or balance problems.  He agrees to the plan  This was a 35-minute appointment discussing his diagnosis and other issues as noted above including the treatment plan.  Over 50% of this 35 minutes was spent in counseling and coordination of care.  Follow-up 4 weeks  Iona Hansenarey, MD,  DFAPA  Future Appointments  Date Time Provider Department Center  12/06/2018  2:30 PM Cottle, Steva Readyarey G Jr., MD [facility] None    No orders of the defined types were placed in this encounter.     -------------------------------

## 2018-11-08 ENCOUNTER — Other Ambulatory Visit: Payer: Self-pay

## 2018-11-08 ENCOUNTER — Telehealth: Payer: Self-pay | Admitting: Psychiatry

## 2018-11-08 MED ORDER — TRAZODONE HCL 50 MG PO TABS: 50.0000 mg | ORAL_TABLET | Freq: Every day | ORAL | 1 refills | Status: DC

## 2018-11-08 NOTE — Telephone Encounter (Signed)
Refill sent, just had appt 11/04/2018 pt may have forgotten to ask for refills

## 2018-11-08 NOTE — Telephone Encounter (Signed)
Mom left v-mail stating need Trazadone 50 mg Rx sent to pharmacy to help with sleep.

## 2018-11-15 ENCOUNTER — Telehealth: Payer: Self-pay | Admitting: Psychiatry

## 2018-11-15 NOTE — Telephone Encounter (Signed)
On taking Oxycarbazepine- he has been taking this since last appt.  He is still taking the Risperdal at bedtime. Kei feels he wants to stop the Risperdal but I confirmed that he is supposed to still be taking it. She will tell him. Is it safe for patient to drive himself to his next appt?  Also, she is trying to cut up his Oxycarbazepine ahead of time, any problem with cutting them up in half ahead of time?

## 2018-11-15 NOTE — Telephone Encounter (Signed)
It is noted that the patient has had continued complaints of feeling sedated from the risperidone but also more recently having some depression which is unrelated to risperidone.  He has failed to tolerate a couple of different mood stabilizers and we need to proceed with the plan as it is currently been designed and will make changes at follow-up.

## 2018-11-15 NOTE — Telephone Encounter (Signed)
He is supposed to continue the risperidone dose that he is currently on until his appointment with me in 3 weeks and then we will look at discontinuing it assuming he is doing well with the oxcarbazepine.  Remind her that the oxcarbazepine is a very mild mood stabilizer and I am not certain that he is on a sufficient dose.  We will make that decision at his follow-up appointment and if it is sufficient we will discontinue the risperidone at that time.  I understand he wants to get off of it as soon as possible.  As long as he is not drowsy it safe for him to drive.  There is no problem with cutting the oxcarbazepine tablets in advance of taking them even if it is been cut for a month.

## 2018-11-15 NOTE — Telephone Encounter (Signed)
Left voicemail to call back with information 

## 2018-12-06 ENCOUNTER — Encounter: Payer: Self-pay | Admitting: Psychiatry

## 2018-12-06 ENCOUNTER — Other Ambulatory Visit: Payer: Self-pay

## 2018-12-06 ENCOUNTER — Ambulatory Visit (INDEPENDENT_AMBULATORY_CARE_PROVIDER_SITE_OTHER): Payer: BLUE CROSS/BLUE SHIELD | Admitting: Psychiatry

## 2018-12-06 DIAGNOSIS — F319 Bipolar disorder, unspecified: Secondary | ICD-10-CM

## 2018-12-06 DIAGNOSIS — F411 Generalized anxiety disorder: Secondary | ICD-10-CM

## 2018-12-06 MED ORDER — OXCARBAZEPINE 300 MG PO TABS: ORAL_TABLET | ORAL | 1 refills | Status: DC

## 2018-12-06 MED ORDER — OLANZAPINE 5 MG PO TABS: 5.0000 mg | ORAL_TABLET | Freq: Every day | ORAL | 1 refills | Status: DC

## 2018-12-06 NOTE — Patient Instructions (Addendum)
Stop risperidone    Start olanzapine 5 mg 1 tablet in the evening as a bridge until can optimize the Trileptal dosing.  Take 2-3 hours before bedtime  Increase Trileptal to 1 1/2 tablets in the morning and 2 in the evening.  Call back by Friday if the anxiety is not markedly better

## 2018-12-06 NOTE — Progress Notes (Signed)
William BillsDavid S Frye [id number] 06/18/1955 63 y.o.     Subjective:   Patient ID:  William Frye is a 63 y.o. (DOB 12/27/1955) male.  Chief Complaint:  No chief complaint on file.   Depression        Associated symptoms include fatigue. Medication Refill Associated symptoms include fatigue. Pertinent negatives include no weakness.   William Frye presents to the office today for follow-up of bipolar.   At visit was August 25, 2018.  He had stopped lithium on his own because of complaints of diarrhea.  We had to select an alternative mood stabilizer and picked Abilify 15 mg daily. He is called multiple times since that visit.  Or else his sister has called for him.  On July 6 that his sister called stating he was having new onset auditory hallucinations.  It was suggested that he increase Abilify to get a better antipsychotic effect at 22.5 mg daily.  They called back again on July 13 stating that he was still having auditory and visual hallucinations.  Abilify was discontinued and recommendation was to start risperidone 3 mg nightly.  His sister called again on July 17 stating that he was not sleeping and still hallucinating and was disoriented.  At that point hospitalization was recommended.  That day he was [facility] and continued on risperidone.  He was discharged on risperidone 3 mg nightly quetiapine 100 mg nightly for sleep and buspirone 15 mg twice daily.  He was discharged July 23.  The psychiatric resident there Dr. Nedra HaiLee indicated his psychosis had improved.  At visit August 24 he was very frustrated with risperidone feeling like a "zombie" and wanted to stop all medications.  He was strongly encouraged not to do so because of the recent psychosis.  He agreed to a dosage reduction from 3 mg to 2 mg daily to reduce side effects.  At his last visit November 04, 2018 the decision was made to make the following changes: After [name]  returns from trip, Reduce risperidone to 1 mg at night and start oxcarbazepine (Trileptal) 1/2 twice daily for 1 week, Then 1 twice daily.  Seen alone.  Rough month.  Anxiety got me crawling the walls.  Body nervousness has gotten worse with reduction in risperidone.  Slight change in SE.  Less l like a zombie, not worth the trade for the more anxiety.  Still low energy.  Feels like body vibrating but not consistent and irregular pattern.  Also worry.  Worry over life situation.  Pessimistic about the future.  Sleep was good awhile and he tried to stop it but finds he needs trazodone some.  Average at least 6 hours.  His mother says he spends all day in bed.  He says that's not true.  Never had much in between either full on or off.  Still wants to stop all meds for a while.  Wants to prove to himself that the med is wrong.    Last hallucinations in hospital.  Per D dramatically better after the hospitalization.  Patient reports stable mood and denies  irritable moods.  Generally still depressed but much less than the anxious but not severe.  Patient denies difficulty with sleep initiation or maintenance. At least 8 hours. Denies appetite disturbance.  Patient reports that energy and motivation have been low.  Patient has easy difficulty with concentration.  Patient denies any suicidal ideation.  A struggle to make himself walk.  Staying with Dianne.   She sees him as flat.    Past psychiatric medications include Depakote and Prozac NR.  Seroquel side effects, buspirone. Abilify worse..  Lithium diarrhea. Risperidone 3.  NO FURTHER PARTIAL DOPAMINE AGONIST TRIALS UNLESS ABSOLUTELY NECESSARY DT POOR RESPONSE FROM ARIPIPRAZOLE.  Review of Systems:  Review of Systems  Constitutional: Positive for fatigue.  Gastrointestinal: Positive for constipation. Negative for diarrhea.  Neurological: Negative for tremors and weakness.  Psychiatric/Behavioral: Positive for agitation and depression. Negative for  behavioral problems, confusion and hallucinations.       Fidgety    Medications: I have reviewed the patient's current medications.  Current Outpatient Medications  Medication Sig Dispense Refill  . Oxcarbazepine (TRILEPTAL) 300 MG tablet 1/2 tablet twice daily for 1 week, then 1 twice daily. 60 tablet 1  . risperiDONE (RISPERDAL) 1 MG tablet Take 1 tablet (1 mg total) by mouth at bedtime. 30 tablet 0  . traZODone (DESYREL) 50 MG tablet Take 1 tablet (50 mg total) by mouth at bedtime. 30 tablet 1   No current facility-administered medications for this visit.     Medication Side Effects: None  Allergies:  Allergies  Allergen Reactions  . Lithium Diarrhea and Nausea Only  . Seroquel [Quetiapine Fumarate]     Psychosis  . Aripiprazole Other (See Comments)    Auditory and visual hallucinations    Past Medical History:  Diagnosis Date  . Depression   . 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 Frequent headaches     Family History  Problem Relation Age of Onset  . Hyperlipidemia Mother   . COPD Father   . Cancer Father   . Depression Brother   . Drug abuse Brother   . Early death Brother   . Mental illness Brother   . Depression Sister     Social History   Socioeconomic History  . Marital status: Divorced    Spouse name: Not on file  . Number of children: Not on file  . Years of education: Not on file  . Highest education level: Not on file  Occupational History  . Not on file  Social Needs  . Financial resource strain: Not on file  . Food insecurity    Worry: Not on file    Inability: Not on file  . Transportation needs    Medical: Not on file    Non-medical: Not on file  Tobacco Use  . Smoking status: Current Every Day Smoker    Types: Cigarettes  . Smokeless tobacco: Never Used  Substance and Sexual Activity  . Alcohol use: Not Currently  . Drug use: Never  . Sexual activity: Not Currently    Partners: Female  Lifestyle  . Physical activity     Days per week: Not on file    Minutes per session: Not on file  . Stress: Not on file  Relationships  . Social Herbalist on phone: Not on file    Gets together: Not on file    Attends religious service: Not on file    Active member of club or organization: Not on file    Attends meetings of clubs or organizations: Not on file    Relationship status: Not on file  . Intimate partner violence    Fear of current or ex partner: Not on file    Emotionally abused: Not on file    Physically abused: Not on file    Forced sexual activity: Not on file  Other Topics Concern  . Not on file  Social History Narrative  . Not on file    Past Medical History, Surgical history, Social history, and Family history were reviewed and updated as appropriate.   Please see review of systems for further details on the patient's review from today.   Objective:   Physical Exam:  There were no vitals taken for this visit.  Physical Exam Constitutional:      General: He is not in acute distress.    Appearance: Normal appearance. He is well-developed. He is obese.  Musculoskeletal:        General: No deformity.  Neurological:     Mental Status: He is alert and oriented to person, place, and time.     Coordination: Coordination normal.  Psychiatric:        Attention and Perception: He is attentive. He does not perceive auditory hallucinations.        Mood and Affect: Mood is anxious and depressed. Affect is not labile, blunt, angry or inappropriate.        Speech: Speech normal.        Behavior: Behavior normal.        Thought Content: Thought content normal. Thought content does not include homicidal or suicidal ideation. Thought content does not include homicidal or suicidal plan.        Cognition and Memory: Cognition normal.        Judgment: Judgment is not impulsive.     Comments: Insight fair . No auditory or visual hallucinations. No delusions. He is not markedly depressed nor  anxious.  Fidgety.     Lab Review:     Component Value Date/Time   NA 140 02/15/2018 1141   K 4.3 02/15/2018 1141   CL 103 02/15/2018 1141   CO2 28 02/15/2018 1141   GLUCOSE 103 (H) 02/15/2018 1141   BUN 15 02/15/2018 1141   CREATININE 1.05 02/15/2018 1141   CALCIUM 9.5 02/15/2018 1141   PROT 6.7 02/15/2018 1141   ALBUMIN 4.4 02/15/2018 1141   AST 20 02/15/2018 1141   ALT 19 02/15/2018 1141   ALKPHOS 136 (H) 02/15/2018 1141   BILITOT 0.8 02/15/2018 1141       Component Value Date/Time   WBC 6.7 02/15/2018 1141   RBC 5.69 02/15/2018 1141   HGB 17.7 (H) 02/15/2018 1141   HCT 52.8 (H) 02/15/2018 1141   PLT 235.0 02/15/2018 1141   MCV 92.7 02/15/2018 1141   MCHC 33.6 02/15/2018 1141   RDW 14.5 02/15/2018 1141   LYMPHSABS 1.3 02/15/2018 1141   MONOABS 0.5 02/15/2018 1141   EOSABS 0.1 02/15/2018 1141   BASOSABS 0.0 02/15/2018 1141    Lithium Lvl  Date Value Ref Range Status  07/07/2018 0.9 0.6 - 1.2 mmol/L Final     No results found for: PHENYTOIN, PHENOBARB, VALPROATE, CBMZ   .res Assessment: Plan:    There are no diagnoses linked to this encounter.   Dennies has had an unusual pattern of psychiatric symptoms over the last 6 weeks.  He had been reasonably stable on lithium with regard to his mood disorder but abruptly stopped it due to complaints about diarrhea.  At his follow-up visit in June we had discussion about alternative mood stabilizers.  Abilify 15 mg was prescribed.  Subsequently he became very psychotic without obvious mania.  The psychosis required hospitalization.  It resolved with 3 mg of risperidone.  Still complaining of of cognitive and physical slowing symptoms related to risperidone.  He feels too flat.  Wants to stop all medications.  Strongly discouraged abruptly stopping this medication after a recent psychiatric hospitalization with psychosis.  High risk of recurrence of psychosis or bipolar symptoms.  Discussed alternative mood stabilizers.   Discussed his diagnosis again that he is bipolar and needs long-term mood stabilization.  He is fearful of changing medications because of what he believes was an adverse reaction to Abilify.  I propose the alternative of a small reduction in risperidone dose to reduce the side effect now that his psychosis has resolved.  We can also consider switching to an alternative mood stabilizer since reducing from 3 mg to 2 mg did not significantly help his sense of flatness.  We discussed the risk that his psychosis could recur even with reduction in risperidone, but his anxiety is worse.Onalee Hua has residual depression and anxiety but we will not attempt further medication changes at this time.  Options for mood stabilizer: CBZ, Trileptal other atypicals.   need to be cost effective. Disc SE incl TD, DM, EPS, sedation.  Discussed Trileptal is off label and is often not as effective as the other more traditional mood stabilizers but it is milder and it is cheaper.  Cost is a concern.  No further partial DA agonist.  Because of the potential adverse reaction to Abilify we will avoid the use of other partial dopamine agonist such as Vraylar or Rexulti.  DC risperidone DT SE Start olanzapine  pm as bridge until can optimize the Trileptal dosing. Increase Trileptal to 1 1/2 tablets in the morning and 2 in the evening. Discussed the side effects of Trileptal including the rare risk of Stevens-Johnson syndrome and hyponatremia as well as the more typical side effects of sleepiness and dizziness or balance problems.  He agrees to the plan  This was a 3-minute appointment discussing his diagnosis and other issues as noted above including the treatment plan.  Over 50% of this 30 minutes was spent in counseling and coordination of care.  Follow-up 4 weeks  Iona Hansen, MD, DFAPA  No future appointments.  No orders of the defined types were placed in this encounter.     -------------------------------

## 2018-12-09 ENCOUNTER — Telehealth: Payer: Self-pay | Admitting: Psychiatry

## 2018-12-09 NOTE — Telephone Encounter (Signed)
Increase Trileptal to 2 tablets twice a day.  He will dealing with a good bit of anxiety and/or insomnia then increase olanzapine to 2 of the 5 mg tablets each evening

## 2018-12-09 NOTE — Telephone Encounter (Signed)
Pt called to report as advised by Friday if meds needed to be increased. Pt feels they need to be increased. Please advise [phone]

## 2018-12-09 NOTE — Telephone Encounter (Signed)
When he is ready for a new prescription we can send in olanzapine 10 mg tablets 1 nightly.  It is best for him to use up what he has currently to ensure that he tolerates that dosage.

## 2018-12-12 ENCOUNTER — Telehealth: Payer: Self-pay | Admitting: Psychiatry

## 2018-12-12 ENCOUNTER — Other Ambulatory Visit: Payer: Self-pay

## 2018-12-12 DIAGNOSIS — F319 Bipolar disorder, unspecified: Secondary | ICD-10-CM

## 2018-12-12 MED ORDER — OLANZAPINE 15 MG PO TABS: 15.0000 mg | ORAL_TABLET | Freq: Every day | ORAL | 1 refills | Status: DC

## 2018-12-12 NOTE — Telephone Encounter (Signed)
Sister called back to verify doses of medication, pt increased Trileptal 300 mg 2 in the am and 2 at hs. Confirmed with [name] okay to continue that and increase olanzapine to 15 mg at hs. Updated Rx submitted to [facility] per request of family. Instructed to call back with anymore problems or concerns.

## 2018-12-12 NOTE — Telephone Encounter (Signed)
Pt mom Betsy left message stating Pt thought new Rx was being sent to Pharmacy. No Rx at Pharmacy. Please advise @ [phone]

## 2018-12-12 NOTE — Telephone Encounter (Signed)
Will contact patient again to discuss he needs to use what he has then will send in updated Rx as discussed on Friday.

## 2018-12-12 NOTE — Telephone Encounter (Signed)
Patient's sister called and said that William Frye has increased all of his medicines as instructed. The hallucinations started late last night and into the early morning. This is very urgent. Last time this happened he was hospitalizes. They want to stop this before it gets worse and he has to go to the hospital. Please call ASAP [phone]

## 2018-12-12 NOTE — Telephone Encounter (Signed)
Pt stated he is experiencing he feels are side effects from the Trileptal. He states he is seeing things that are not there, but feels that he is not having hallucinations. Stated he could explain when speaks with the doctor.

## 2018-12-12 NOTE — Telephone Encounter (Signed)
The Trileptal or oxcarbazepine is not causing hallucinations.  In 25 years of experience of never seen this occur.  We stopped the risperidone because of his concern with side effects and the hallucinations have occurred because of stopping risperidone.    We did start another medication that could be helpful for hallucinations and that is the olanzapine.  If he does not want to return to risperidone to control the hallucinations alternatively he could increase olanzapine to 15 mg nightly.  The minimum dose of olanzapine to control hallucinations is 15 mg and as of our last contact with him my understanding he was going to take 10 mg.  So I recommend that there be no change in the Trileptal dosage and his choice he can return to risperidone 2 mg each night and stop olanzapine. Or the other option is to just increase olanzapine to 15 mg each night and do not resume risperidone.

## 2018-12-12 NOTE — Telephone Encounter (Signed)
Excellent increase in olanzapine to 15 mg daily to help him feel calmer in 24 hours.  The hallucinations will take likely a few days to resolve.

## 2018-12-13 ENCOUNTER — Other Ambulatory Visit: Payer: Self-pay

## 2018-12-13 DIAGNOSIS — F319 Bipolar disorder, unspecified: Secondary | ICD-10-CM

## 2018-12-13 MED ORDER — OXCARBAZEPINE 300 MG PO TABS: ORAL_TABLET | ORAL | 1 refills | Status: DC

## 2018-12-14 ENCOUNTER — Telehealth: Payer: Self-pay | Admitting: Psychiatry

## 2018-12-14 NOTE — Telephone Encounter (Signed)
Sister, William Frye, call wanting to report on William Frye's behavior.  He is hallucinating again and up at night talking to people who are not there.  They need to make some decisions about William Frye's care, etc. And would like you advice on options they are considering.  Please call.  [phone]

## 2018-12-14 NOTE — Telephone Encounter (Signed)
He only increased olanzapine 2 days ago to 15 mg daily.  We cannot make further med changes on an outpatient basis over the next 5 days.  He needs a week on this to judge its effectiveness.  If his behavior is unmanageable then the only other option is hospitalization.  The current dose of 15 mg of olanzapine is a normal antipsychotic dosage.  It is 1 of the best antipsychotics that exists.

## 2018-12-16 NOTE — Telephone Encounter (Signed)
Thanks for the update

## 2018-12-20 ENCOUNTER — Telehealth: Payer: Self-pay

## 2018-12-20 DIAGNOSIS — F319 Bipolar disorder, unspecified: Secondary | ICD-10-CM

## 2018-12-20 MED ORDER — OLANZAPINE 20 MG PO TABS: 20.0000 mg | ORAL_TABLET | Freq: Every day | ORAL | 1 refills | Status: DC

## 2018-12-20 MED ORDER — LORAZEPAM 1 MG PO TABS: ORAL_TABLET | ORAL | 0 refills | Status: DC

## 2018-12-20 NOTE — Telephone Encounter (Signed)
Rtc to sister, [name], they had contacted the after hours emergency number at 8 am due to [facility] hallucinating, trying to crawl out a window, patient has been up all night with Mom and sister. Patient experiencing a lot of mania. Patient had slept on Friday, Saturday and Sunday per reported and is compliant with medications. Patient has complained of weak in his legs.  Consulted with [name] and his recommendation is to increase Zyprexa to 20 mg q hs, decrease Trileptal 300 mg to taking 2 in the pm only, continue trazodone 50 mg prn sleep and adding Ativan 1 mg qhs prn sleep/anxiety. [name] did mention Regie lays down right after dinner around 5pm, he usually receives his evening meds around 4 pm. Advised them to give the Ativan later if needed for sleep/anxiety.Family is aware of changes and update Rx will be sent to their [facility] on [address] in [place]. Discussed options of someone coming to stay with pt during the night when he has these episodes, explained it would be best for them to pay someone directly if they choose this due to no insurance. Encouraged them should only be for the short term if it did come down to that. [name] is just trying to think ahead. Appreciative of phone calls, advised them to call if further issues or concerns.

## 2018-12-25 MED ORDER — OXCARBAZEPINE 300 MG PO TABS
600.00 | ORAL_TABLET | ORAL | Status: DC
Start: 2018-12-25 — End: 2018-12-25

## 2018-12-25 MED ORDER — MELATONIN 3 MG PO TABS
6.00 | ORAL_TABLET | ORAL | Status: DC
Start: 2018-12-25 — End: 2018-12-25

## 2018-12-25 MED ORDER — KETOROLAC TROMETHAMINE 10 MG PO TABS
10.00 | ORAL_TABLET | ORAL | Status: DC
Start: ? — End: 2018-12-25

## 2018-12-25 MED ORDER — LORAZEPAM 1 MG PO TABS
1.00 | ORAL_TABLET | ORAL | Status: DC
Start: ? — End: 2018-12-25

## 2018-12-25 MED ORDER — ACETAMINOPHEN 325 MG PO TABS
650.00 | ORAL_TABLET | ORAL | Status: DC
Start: ? — End: 2018-12-25

## 2018-12-25 MED ORDER — OLANZAPINE 10 MG PO TABS
20.00 | ORAL_TABLET | ORAL | Status: DC
Start: 2018-12-25 — End: 2018-12-25

## 2018-12-25 MED ORDER — TRAZODONE HCL 50 MG PO TABS
50.00 | ORAL_TABLET | ORAL | Status: DC
Start: ? — End: 2018-12-25

## 2018-12-25 MED ORDER — ENOXAPARIN SODIUM 40 MG/0.4ML ~~LOC~~ SOLN
40.00 | SUBCUTANEOUS | Status: DC
Start: 2018-12-25 — End: 2018-12-25

## 2018-12-30 ENCOUNTER — Ambulatory Visit (INDEPENDENT_AMBULATORY_CARE_PROVIDER_SITE_OTHER): Payer: Self-pay | Admitting: Psychiatry

## 2018-12-30 ENCOUNTER — Encounter: Payer: Self-pay | Admitting: Psychiatry

## 2018-12-30 ENCOUNTER — Other Ambulatory Visit: Payer: Self-pay

## 2018-12-30 DIAGNOSIS — F411 Generalized anxiety disorder: Secondary | ICD-10-CM

## 2018-12-30 DIAGNOSIS — G4721 Circadian rhythm sleep disorder, delayed sleep phase type: Secondary | ICD-10-CM

## 2018-12-30 DIAGNOSIS — F28 Other psychotic disorder not due to a substance or known physiological condition: Secondary | ICD-10-CM

## 2018-12-30 DIAGNOSIS — F3132 Bipolar disorder, current episode depressed, moderate: Secondary | ICD-10-CM

## 2018-12-30 DIAGNOSIS — Z79899 Other long term (current) drug therapy: Secondary | ICD-10-CM

## 2018-12-30 MED ORDER — GENERIC EXTERNAL MEDICATION
Status: DC
Start: ? — End: 2018-12-30

## 2018-12-30 MED ORDER — MELATONIN 3 MG PO TABS
6.00 | ORAL_TABLET | ORAL | Status: DC
Start: 2018-12-29 — End: 2018-12-30

## 2018-12-30 MED ORDER — TRAZODONE HCL 50 MG PO TABS
50.00 | ORAL_TABLET | ORAL | Status: DC
Start: ? — End: 2018-12-30

## 2018-12-30 MED ORDER — ASPIRIN 81 MG PO CHEW
81.00 | CHEWABLE_TABLET | ORAL | Status: DC
Start: 2018-12-30 — End: 2018-12-30

## 2018-12-30 MED ORDER — ALBUTEROL SULFATE (2.5 MG/3ML) 0.083% IN NEBU
2.50 | INHALATION_SOLUTION | RESPIRATORY_TRACT | Status: DC
Start: ? — End: 2018-12-30

## 2018-12-30 MED ORDER — OLANZAPINE 10 MG PO TABS
20.00 | ORAL_TABLET | ORAL | Status: DC
Start: 2018-12-29 — End: 2018-12-30

## 2018-12-30 MED ORDER — LITHIUM CARBONATE ER 300 MG PO TBCR
300.00 | EXTENDED_RELEASE_TABLET | ORAL | Status: DC
Start: 2018-12-29 — End: 2018-12-30

## 2018-12-30 MED ORDER — ACETAMINOPHEN 325 MG PO TABS
650.00 | ORAL_TABLET | ORAL | Status: DC
Start: ? — End: 2018-12-30

## 2018-12-30 MED ORDER — CLOTRIMAZOLE-BETAMETHASONE 1-0.05 % EX CREA
TOPICAL_CREAM | CUTANEOUS | Status: DC
Start: 2018-12-29 — End: 2018-12-30

## 2018-12-30 MED ORDER — OXCARBAZEPINE 300 MG PO TABS
600.00 | ORAL_TABLET | ORAL | Status: DC
Start: 2018-12-29 — End: 2018-12-30

## 2018-12-30 MED ORDER — POLYETHYLENE GLYCOL 3350 17 G PO PACK
17.00 | PACK | ORAL | Status: DC
Start: ? — End: 2018-12-30

## 2018-12-30 MED ORDER — INFLUENZA VAC SUBUNIT QUAD 0.5 ML IM SUSY
0.50 | PREFILLED_SYRINGE | INTRAMUSCULAR | Status: DC
Start: ? — End: 2018-12-30

## 2018-12-30 MED ORDER — ALUM & MAG HYDROXIDE-SIMETH 200-200-20 MG/5ML PO SUSP
30.00 | ORAL | Status: DC
Start: ? — End: 2018-12-30

## 2018-12-30 NOTE — Progress Notes (Signed)
William Frye [id number] 1955/04/17 63 y.o.     Subjective:   Patient ID:  William Frye is a 63 y.o. (DOB 25-Jul-1955) male.  Chief Complaint:  Chief Complaint  Patient presents with  . Follow-up    Medication Management  . Anxiety    Medication Management  . Other    Bipolar 1    Depression        Associated symptoms include fatigue.  Past medical history includes anxiety.   Medication Refill Associated symptoms include fatigue. Pertinent negatives include no weakness.  Anxiety Patient reports no confusion.     William Frye presents to the office today for follow-up of bipolar psychosis and recent hospitalization.   Seen with sister today.  At visit was August 25, 2018.  He had stopped lithium on his own because of complaints of diarrhea.  We had to select an alternative mood stabilizer and picked Abilify 15 mg daily. He is called multiple times since that visit.  Or else his sister has called for him.  On July 6 that his sister called stating he was having new onset auditory hallucinations.  It was suggested that he increase Abilify to get a better antipsychotic effect at 22.5 mg daily.  They called back again on July 13 stating that he was still having auditory and visual hallucinations.  Abilify was discontinued and recommendation was to start risperidone 3 mg nightly.  His sister called again on July 17 stating that he was not sleeping and still hallucinating and was disoriented.  At that point hospitalization was recommended.  That day he was admitted to [facility] psychiatric unit and continued on risperidone.  He was discharged on risperidone 3 mg nightly quetiapine 100 mg nightly for sleep and buspirone 15 mg twice daily.  He was discharged July 23.  The psychiatric resident there Dr. Nedra Hai indicated his psychosis had improved.  At visit August 24 he was very frustrated with risperidone feeling like a "zombie" and wanted to stop all  medications.  He was strongly encouraged not to do so because of the recent psychosis.  He agreed to a dosage reduction from 3 mg to 2 mg daily to reduce side effects.  At his last visit November 04, 2018 the decision was made to make the following changes: After William Frye returns from trip, Reduce risperidone to 1 mg at night and start oxcarbazepine (Trileptal) 1/2 twice daily for 1 week, Then 1 twice daily.  At his last visit December 06, 2018 the following changes were made: DC risperidone DT SE Start olanzapine  pm as bridge until can optimize the Trileptal dosing. Increase Trileptal to 1 1/2 tablets in the morning and 2 in the evening.  Had another psych hosp October 21 and December 25, 2018 for psychosis.  Spoke with Dr. Seth Bake about his care.  She thought it wise to restart a low-dose of lithium because he had had improvement with lithium but had diarrhea. He was discharged on lithium 300 mg nightly Oxcarbazepine 600 mg nightly Trazodone 50 mg nightly as needed Olanzapine increased to 20 mg nightly for psychosis  DC from hospital yesterday.  Feels foggy and balance issues.  Anxiety is lessened.  PTH hallucinations scared mother and acting weird.  Imagined rock and roll band breaking into the house.  Hallucinations worse at night.  Paranoid.  Broke windows.  No drugs pot use.   Last hallucinations after a few days in the hospital per sister. Last night did  nok.  Slept well.  No fearful thoughts but concerns over recent psychosis.    Depression still there like a blanket.  PTH D noted he was hyperactive and hyperverbal and talking to himself and having AH and VH.  No SI.  Currently no SE except weak in legs and poor balance.  No diarrhea.  Sister notes worry he might fall.  Shuffle.  Groggy and wants to sleep a lot.  She notes poor concentration and memory.    A struggle to make himself walk.  Staying with Dianne.   She sees him as flat.    Past psychiatric medications include  Depakote and Prozac NR.  Seroquel side effects, buspirone. Abilify worse..  Lithium diarrhea. Risperidone 3.  NO FURTHER PARTIAL DOPAMINE AGONIST TRIALS UNLESS ABSOLUTELY NECESSARY DT POOR RESPONSE FROM ARIPIPRAZOLE.  Review of Systems:  Review of Systems  Constitutional: Positive for fatigue.  Gastrointestinal: Positive for constipation. Negative for diarrhea.  Neurological: Negative for tremors and weakness.  Psychiatric/Behavioral: Positive for agitation and depression. Negative for behavioral problems, confusion and hallucinations.       Fidgety    Medications: I have reviewed the patient's current medications.  Current Outpatient Medications  Medication Sig Dispense Refill  . aspirin 81 MG chewable tablet Chew by mouth.    . clotrimazole-betamethasone (LOTRISONE) cream Apply topically.    Marland Kitchen. lithium carbonate (LITHOBID) 300 MG CR tablet Take by mouth.    . Melatonin 3 MG TABS Take by mouth.    . OLANZapine (ZYPREXA) 20 MG tablet Take 1 tablet (20 mg total) by mouth at bedtime. 30 tablet 1  . Oxcarbazepine (TRILEPTAL) 300 MG tablet Take 2 tablets every morning and 2 in the evening (Patient taking differently: Take 600 mg by mouth every evening. ) 120 tablet 1  . traZODone (DESYREL) 50 MG tablet Take 1 tablet (50 mg total) by mouth at bedtime. 30 tablet 1   No current facility-administered medications for this visit.     Medication Side Effects: None  Allergies:  Allergies  Allergen Reactions  . Lithium Diarrhea and Nausea Only  . Seroquel [Quetiapine Fumarate]     Psychosis  . Aripiprazole Other (See Comments)    Auditory and visual hallucinations    Past Medical History:  Diagnosis Date  . Depression   . Emphysema with chronic bronchitis (HCC)   . Frequent headaches     Family History  Problem Relation Age of Onset  . Hyperlipidemia Mother   . COPD Father   . Cancer Father   . Depression Brother   . Drug abuse Brother   . Early death Brother   . Mental  illness Brother   . Depression Sister     Social History   Socioeconomic History  . Marital status: Divorced    Spouse name: Not on file  . Number of children: Not on file  . Years of education: Not on file  . Highest education level: Not on file  Occupational History  . Not on file  Social Needs  . Financial resource strain: Not on file  . Food insecurity    Worry: Not on file    Inability: Not on file  . Transportation needs    Medical: Not on file    Non-medical: Not on file  Tobacco Use  . Smoking status: Current Every Day Smoker    Types: Cigarettes  . Smokeless tobacco: Never Used  Substance and Sexual Activity  . Alcohol use: Not Currently  . Drug use: Never  .  Sexual activity: Not Currently    Partners: Female  Lifestyle  . Physical activity    Days per week: Not on file    Minutes per session: Not on file  . Stress: Not on file  Relationships  . Social Musician on phone: Not on file    Gets together: Not on file    Attends religious service: Not on file    Active member of club or organization: Not on file    Attends meetings of clubs or organizations: Not on file    Relationship status: Not on file  . Intimate partner violence    Fear of current or ex partner: Not on file    Emotionally abused: Not on file    Physically abused: Not on file    Forced sexual activity: Not on file  Other Topics Concern  . Not on file  Social History Narrative  . Not on file    Past Medical History, Surgical history, Social history, and Family history were reviewed and updated as appropriate.   Please see review of systems for further details on the patient's review from today.   Objective:   Physical Exam:  There were no vitals taken for this visit.  Physical Exam Constitutional:      General: He is not in acute distress.    Appearance: Normal appearance. He is well-developed. He is obese.  Musculoskeletal:        General: No deformity.   Neurological:     Mental Status: He is alert and oriented to person, place, and time.     Coordination: Coordination normal.  Psychiatric:        Attention and Perception: He is attentive. He does not perceive auditory hallucinations.        Mood and Affect: Mood is anxious and depressed. Affect is not labile, blunt, angry or inappropriate.        Speech: Speech normal.        Behavior: Behavior normal.        Thought Content: Thought content normal. Thought content does not include homicidal or suicidal ideation. Thought content does not include homicidal or suicidal plan.        Cognition and Memory: Cognition normal.        Judgment: Judgment is not impulsive.     Comments: Insight fair . No auditory or visual hallucinations. No delusions. He is not markedly depressed nor anxious.  Fidgety.     Lab Review:     Component Value Date/Time   NA 140 02/15/2018 1141   K 4.3 02/15/2018 1141   CL 103 02/15/2018 1141   CO2 28 02/15/2018 1141   GLUCOSE 103 (H) 02/15/2018 1141   BUN 15 02/15/2018 1141   CREATININE 1.05 02/15/2018 1141   CALCIUM 9.5 02/15/2018 1141   PROT 6.7 02/15/2018 1141   ALBUMIN 4.4 02/15/2018 1141   AST 20 02/15/2018 1141   ALT 19 02/15/2018 1141   ALKPHOS 136 (H) 02/15/2018 1141   BILITOT 0.8 02/15/2018 1141       Component Value Date/Time   WBC 6.7 02/15/2018 1141   RBC 5.69 02/15/2018 1141   HGB 17.7 (H) 02/15/2018 1141   HCT 52.8 (H) 02/15/2018 1141   PLT 235.0 02/15/2018 1141   MCV 92.7 02/15/2018 1141   MCHC 33.6 02/15/2018 1141   RDW 14.5 02/15/2018 1141   LYMPHSABS 1.3 02/15/2018 1141   MONOABS 0.5 02/15/2018 1141   EOSABS 0.1 02/15/2018 1141  BASOSABS 0.0 02/15/2018 1141    Lithium Lvl  Date Value Ref Range Status  07/07/2018 0.9 0.6 - 1.2 mmol/L Final     No results found for: PHENYTOIN, PHENOBARB, VALPROATE, CBMZ   .res Assessment: Plan:    William Frye was seen today for follow-up, anxiety and other.  Diagnoses and all orders for  this visit:  Bipolar disorder with moderate depression ([place])  Generalized anxiety disorder  Other psychotic disorder not due to substance or known physiological condition ([place])  Delayed sleep phase syndrome  High risk medication use     Greater than 50% of 45 minface to face time with patient was spent on counseling and coordination of care. We discussed William Frye has had an unusual pattern of psychiatric symptoms over the last 6 weeks.  He had been reasonably stable on lithium with regard to his mood disorder but abruptly stopped it due to complaints about diarrhea.  At his follow-up visit in June we had discussion about alternative mood stabilizers.  Abilify 15 mg was prescribed.  Subsequently he became very psychotic without obvious mania.  The psychosis required hospitalization.  It resolved with 3 mg of risperidone.  Still complaining of of cognitive and physical slowing symptoms related to risperidone.  He feels too flat.  Wants to stop all medications.  Strongly discouraged abruptly stopping this medication after a recent psychiatric hospitalization with psychosis.  High risk of recurrence of psychosis or bipolar symptoms.  Discussed alternative mood stabilizers.  Discussed his diagnosis again that he is bipolar and needs long-term mood stabilization.  He is fearful of changing medications because of what he believes was an adverse reaction to Abilify.  I propose the alternative of a small reduction in risperidone dose to reduce the side effect now that his psychosis has resolved.  We can also consider switching to an alternative mood stabilizer since reducing from 3 mg to 2 mg did not significantly help his sense of flatness.  We discussed the risk that his psychosis could recur even with reduction in risperidone, but his anxiety is worse.William Frye has residual depression and anxiety but we will not attempt further medication changes at this time.  Options for mood stabilizer: CBZ,  Trileptal other atypicals.   need to be cost effective. Disc SE incl TD, DM, EPS, sedation.  Discussed Trileptal is off label and is often not as effective as the other more traditional mood stabilizers but it is milder and it is cheaper.  Cost is a concern.  No further partial DA agonist.  Because of the potential adverse reaction to Abilify we will avoid the use of other partial dopamine agonist such as Vraylar or Rexulti.  Continue olanzapine 20 mg nightly Wean oxcarbazapine DT balance issues Discussed the side effects of Trileptal including the rare risk of Stevens-Johnson syndrome and hyponatremia as well as the more typical side effects of sleepiness and dizziness or balance problems.  He agrees to the plan Hold trzodone   Follow-up 2 weeks  Hiram Comber, MD, DFAPA  Future Appointments  Date Time Provider [place]  01/13/2019  1:30 PM Cottle, Billey Co., MD CP-CP None    No orders of the defined types were placed in this encounter.     -------------------------------

## 2018-12-30 NOTE — Patient Instructions (Signed)
Reduce oxcarbazepine to 1 tablet in evening for 4 days and then stop it.

## 2019-01-04 ENCOUNTER — Emergency Department (HOSPITAL_COMMUNITY)
Admission: EM | Admit: 2019-01-04 | Discharge: 2019-01-05 | Disposition: A | Discharge status: Home / Self Care | Payer: BLUE CROSS/BLUE SHIELD | Attending: Emergency Medicine | Admitting: Emergency Medicine

## 2019-01-04 ENCOUNTER — Encounter (HOSPITAL_COMMUNITY): Payer: Self-pay | Admitting: Obstetrics and Gynecology

## 2019-01-04 ENCOUNTER — Other Ambulatory Visit: Payer: Self-pay

## 2019-01-04 DIAGNOSIS — Z79899 Other long term (current) drug therapy: Secondary | ICD-10-CM | POA: Diagnosis not present

## 2019-01-04 DIAGNOSIS — F17210 Nicotine dependence, cigarettes, uncomplicated: Secondary | ICD-10-CM | POA: Diagnosis not present

## 2019-01-04 DIAGNOSIS — F329 Major depressive disorder, single episode, unspecified: Secondary | ICD-10-CM

## 2019-01-04 DIAGNOSIS — R443 Hallucinations, unspecified: Secondary | ICD-10-CM | POA: Diagnosis present

## 2019-01-04 DIAGNOSIS — F319 Bipolar disorder, unspecified: Secondary | ICD-10-CM | POA: Diagnosis not present

## 2019-01-04 HISTORY — DX: Hallucinations, unspecified: R44.3

## 2019-01-04 LAB — COMPREHENSIVE METABOLIC PANEL
ALT: 22 U/L (ref 0–44)
AST: 19 U/L (ref 15–41)
Albumin: 4 g/dL (ref 3.5–5.0)
Alkaline Phosphatase: 124 U/L (ref 38–126)
Anion gap: 10 (ref 5–15)
BUN: 20 mg/dL (ref 8–23)
CO2: 22 mmol/L (ref 22–32)
Calcium: 9.2 mg/dL (ref 8.9–10.3)
Chloride: 107 mmol/L (ref 98–111)
Creatinine, Ser: 1.04 mg/dL (ref 0.61–1.24)
GFR calc Af Amer: >60 mL/min (ref >60)
GFR calc non Af Amer: >60 mL/min (ref >60)
Glucose, Bld: 106 mg/dL — ABNORMAL HIGH (ref 70–99)
Potassium: 4.1 mmol/L (ref 3.5–5.1)
Sodium: 139 mmol/L (ref 135–145)
Total Bilirubin: 0.9 mg/dL (ref 0.3–1.2)
Total Protein: 7.6 g/dL (ref 6.5–8.1)

## 2019-01-04 LAB — RAPID URINE DRUG SCREEN, HOSP PERFORMED
Amphetamines: NOT DETECTED
Barbiturates: NOT DETECTED
Benzodiazepines: NOT DETECTED
Cocaine: NOT DETECTED
Opiates: NOT DETECTED
Tetrahydrocannabinol: NOT DETECTED

## 2019-01-04 LAB — CBC WITH DIFFERENTIAL/PLATELET
Abs Immature Granulocytes: 0.09 10*3/uL — ABNORMAL HIGH (ref 0.00–0.07)
Basophils Absolute: 0.1 10*3/uL (ref 0.0–0.1)
Basophils Relative: 2 %
Eosinophils Absolute: 0.2 10*3/uL (ref 0.0–0.5)
Eosinophils Relative: 3 %
HCT: 49.8 % (ref 39.0–52.0)
Hemoglobin: 16 g/dL (ref 13.0–17.0)
Immature Granulocytes: 1 %
Lymphocytes Relative: 14 %
Lymphs Abs: 1 10*3/uL (ref 0.7–4.0)
MCH: 30 pg (ref 26.0–34.0)
MCHC: 32.1 g/dL (ref 30.0–36.0)
MCV: 93.4 fL (ref 80.0–100.0)
Monocytes Absolute: 0.6 10*3/uL (ref 0.1–1.0)
Monocytes Relative: 8 %
Neutro Abs: 4.9 10*3/uL (ref 1.7–7.7)
Neutrophils Relative %: 72 %
Platelets: 236 10*3/uL (ref 150–400)
RBC: 5.33 MIL/uL (ref 4.22–5.81)
RDW: 13.3 % (ref 11.5–15.5)
WBC: 6.9 10*3/uL (ref 4.0–10.5)
nRBC: 0 % (ref 0.0–0.2)

## 2019-01-04 LAB — ETHANOL: Alcohol, Ethyl (B): <10 mg/dL (ref <10)

## 2019-01-04 NOTE — ED Triage Notes (Signed)
Per EMS: Patient is coming from his sister's house.  Patient was d/c from a psychiatric facility on the 29th for hallucinations.  Patient has still reportedly been having the hallucinations and wanders outside. Patient reportedly has visual and auditory hallucinations. Patient reportedly had had falls due to the inability of her to care for him.  Patient reportedly is seeing Chana Bode and other dead entertainers.

## 2019-01-04 NOTE — BH Assessment (Addendum)
Tele Assessment Note   Patient Name: William Frye MRN: [id number] Referring Physician: Osie Cheeks Location of Patient: [place] Location of Provider: Behavioral Health TTS Department  William Frye is a divorced 63 y.o. male who presents voluntarily to [facility] reporting sx of anxiety, depression & episodic hallucinating. Pt states he lashes out at the hallucinations and this scares his mother and his sister. Pt has a history of Bipolar diagnosis. He has been treated outpt by Dr. Alanson Aly for about a year. Pt states he is compliant with rx medications. Pt states meds have changed often in effort to minimize hallucinations. Pt states he is especially frustrated with intense anxiety and hallucinations. Pt denies current suicidal ideation. He reports 1 past attempts in late 1990's. Pt acknowledges multiple symptoms of Depression, including anhedonia, isolating, feelings of worthlessness & guilt, tearfulness, and changes in sleep & appetite. Pt reports he has been staying in bed a lot, with less self care like showering and grooming.   Pt denies homicidal ideation/ history of violence. Pt reports disturbing visual hallucinations and was pretty sure they were auditory as well. Pt paused to consider this answer & was frustrated he wasn't sure. Pt admits feelings of paranoia. Pt states current stressors include anxiety and hallucinations. Pt had difficulty finding words & at times appeared confused. Pt re-filled his water cup, drank it all with straw provided and then asked if that cup was for his urine sample.  Pt lives alternately with his sister, William Frye and mother.  Pt denies hx of abuse. Pt reports there is a family history of depression and substance abuse. Pt reports his brother struggled with substance abuse before killing himself. Pt is not currently working. Pt has impaired insight and judgment. Legal history includes no charges.  Protective factors against suicide include good family  support, no current suicidal ideation, future orientation, therapeutic relationship, & no access to firearms.?  Pt's OP history includes Dr. Lucie Leather. IP history includes [facility] and [facility]. Last admission was at [facility] 12/2018.  Pt denies alcohol/ substance abuse. ? MSE: Pt is casually dressed, alert, oriented x3 with aphasic, rambling speech and restless motor behavior. Eye contact is fair. Pt's mood is anxious and depressed.  Affect is congruent with mood. Thought process is mostly relevant. There is no indication pt is currently responding to internal stimuli or experiencing delusional thought content during assessment. Pt was cooperative throughout assessment.   This Clinical research associate attempted to speak with sister William Frye by telephone- no answer. Mother stated William Frye has been using pt's phone last couple days- his number (([phone])  was also tried without answer. By phone, mother states the family is desperate to get help for pt, especially the hallucinating and he can't remember his medications. Sister William Frye has POA. Mother states pt has only worsened over the past year.    Disposition: Berneice Heinrich, NP recommends overnight observation and assessment by psychiatry in the morning Diagnosis: Bipolar disorder   Past Medical History:  Past Medical History:  Diagnosis Date  . Depression   . Emphysema with chronic bronchitis (HCC)   . Frequent headaches   . Hallucinations     No past surgical history on file.  Family History:  Family History  Problem Relation Age of Onset  . Hyperlipidemia Mother   . COPD Father   . Cancer Father   . Depression Brother   . Drug abuse Brother   . Early death Brother   . Mental illness Brother   .  Depression Sister     Social History:  reports that he has been smoking cigarettes. He has never used smokeless tobacco. He reports previous alcohol use. He reports that he does not use drugs.  Additional Social History:  Alcohol / Drug Use Pain  Medications: See MAR Prescriptions: See MAR Over the Counter: See MAR History of alcohol / drug use?: No history of alcohol / drug abuse  CIWA: CIWA-Ar BP: 121/88 Pulse Rate: 82 COWS:    Allergies:  Allergies  Allergen Reactions  . Lithium Diarrhea and Nausea Only  . Seroquel [Quetiapine Fumarate]     Psychosis  . Aripiprazole Other (See Comments)    Auditory and visual hallucinations    Home Medications: (Not in a hospital admission)   OB/GYN Status:  No LMP for male patient.  General Assessment Data Assessment unable to be completed: Yes Reason for not completing assessment: pt in hall bed Location of Assessment: [facility] ED TTS Assessment: In system Is this a Tele or Face-to-Face Assessment?: Tele Assessment Is this an Initial Assessment or a Re-assessment for this encounter?: Initial Assessment Patient Accompanied by:: N/A Language Other than English: No Living Arrangements: Other (Comment) What gender do you identify as?: Male Marital status: Divorced Living Arrangements: (goes between living with mother and sister) Can pt return to current living arrangement?: Yes Admission Status: Voluntary Is patient capable of signing voluntary admission?: Yes Referral Source: Self/Family/Friend Insurance type: BCBS     Crisis Care Plan Living Arrangements: (goes between living with mother and sister) Name of Psychiatrist: Dr. Alanson Alyary Cottle Name of Therapist: no  Education Status Is patient currently in school?: No Is the patient employed, unemployed or receiving disability?: Unemployed  Risk to self with the past 6 months Suicidal Ideation: No Has patient been a risk to self within the past 6 months prior to admission? : No Suicidal Intent: No Has patient had any suicidal intent within the past 6 months prior to admission? : No Is patient at risk for suicide?: Yes Suicidal Plan?: No Has patient had any suicidal plan within the past 6 months prior to admission? :  No Access to Means: No What has been your use of drugs/alcohol within the last 12 months?: none Previous Attempts/Gestures: Yes How many times?: 1(late 1990's) Other Self Harm Risks: older male; brother killed himself, psychosis Triggers for Past Attempts: Other personal contacts(wife and pt separating) Intentional Self Injurious Behavior: None Family Suicide History: Yes(brother) Recent stressful life event(s): Financial Problems, Recent negative physical changes(turning 62; physical decline; financial stress) Persecutory voices/beliefs?: No("not for a year or so") Depression: Yes Depression Symptoms: Fatigue, Isolating, Insomnia, Despondent, Loss of interest in usual pleasures, Feeling worthless/self pity, Feeling angry/irritable Substance abuse history and/or treatment for substance abuse?: No Suicide prevention information given to non-admitted patients: Not applicable  Risk to Others within the past 6 months Homicidal Ideation: No Does patient have any lifetime risk of violence toward others beyond the six months prior to admission? : No Thoughts of Harm to Others: No Current Homicidal Intent: No Current Homicidal Plan: No Access to Homicidal Means: No History of harm to others?: No Assessment of Violence: None Noted Does patient have access to weapons?: No Criminal Charges Pending?: No Does patient have a court date: No Is patient on probation?: No  Psychosis Hallucinations: Auditory, Visual(not 100% on auditory, visual for sure"  ) Delusions: Persecutory("sometines, but not really a thing for me")     Cognitive Functioning Appetite: Good Have you had any weight changes? : No  Change Sleep: Decreased Total Hours of Sleep: 6 Vegetative Symptoms: Decreased grooming, Not bathing, Staying in bed  ADLScreening [facility] Assessment Services) Patient's cognitive ability adequate to safely complete daily activities?: Yes Patient able to express need for assistance with ADLs?:  Yes Independently performs ADLs?: Yes (appropriate for developmental age)  Prior Inpatient Therapy Prior Inpatient Therapy: Yes Prior Therapy Dates: (10 days ago) Prior Therapy Facilty/Provider(s): [facility] Reason for Treatment: Depression, anxiety & hallucinations  Prior Outpatient Therapy Prior Outpatient Therapy: No Does patient have an ACCT team?: No Does patient have Intensive In-House Services?  : No Does patient have [facility] services? : No Does patient have P4CC services?: No  ADL Screening (condition at time of admission) Patient's cognitive ability adequate to safely complete daily activities?: Yes Is the patient deaf or have difficulty hearing?: No Does the patient have difficulty seeing, even when wearing glasses/contacts?: No Does the patient have difficulty concentrating, remembering, or making decisions?: No Patient able to express need for assistance with ADLs?: Yes Does the patient have difficulty dressing or bathing?: No Independently performs ADLs?: Yes (appropriate for developmental age) Does the patient have difficulty walking or climbing stairs?: Yes Weakness of Legs: Both Weakness of Arms/Hands: None  Home Assistive Devices/Equipment Home Assistive Devices/Equipment: Eyeglasses  Therapy Consults (therapy consults require a physician order) PT Evaluation Needed: No OT Evalulation Needed: No SLP Evaluation Needed: No Abuse/Neglect Assessment (Assessment to be complete while patient is alone) Abuse/Neglect Assessment Can Be Completed: Yes Physical Abuse: Denies Verbal Abuse: Denies Sexual Abuse: Denies Exploitation of patient/patient's resources: Denies Self-Neglect: Denies Values / Beliefs Cultural Requests During Hospitalization: None Spiritual Requests During Hospitalization: None Consults Spiritual Care Consult Needed: No Social Work Consult Needed: No Regulatory affairs officer (For Healthcare) Does Patient Have a Medical Advance Directive?:  No Would patient like information on creating a medical advance directive?: No - Patient declined          Disposition: Letitia Libra, NP recommends overnight observation and assessment by psychiatry in the morning Disposition Initial Assessment Completed for this Encounter: Yes Disposition of Patient: (Observe overnight for safety & stabilization. psychiatry am)  This service was provided via telemedicine using a 2-way, interactive audio and video technology.   Noam Franzen Tora Perches 01/04/2019 6:19 PM

## 2019-01-04 NOTE — ED Notes (Signed)
Pt ambulated to TCU room 29 with belongings   TTS consult called

## 2019-01-04 NOTE — Progress Notes (Signed)
Received Slayter awake in his room at the change of shift. He is somewhat disorganized related to his environment. He denied hearing voices or seeing images this PM. He is taking PO fluids.

## 2019-01-04 NOTE — BH Assessment (Signed)
Spoke with Apolonio Schneiders by phone. Pt is in a hall bed. They will call TTS ([phone]) when there a place pt can be assessed.

## 2019-01-04 NOTE — ED Provider Notes (Addendum)
[facility]-EMERGENCY DEPT Provider Note   CSN: [id number] Arrival date & time: 01/04/19  1207     History   Chief Complaint Chief Complaint  Patient presents with  . Hallucinations    HPI William Frye is a 63 y.o. male.     The history is provided by the patient. No language interpreter was used.  Mental Health Problem Presenting symptoms: agitation and hallucinations   Degree of incapacity (severity):  Moderate Onset quality:  Gradual Timing:  Constant Progression:  Worsening Chronicity:  New Treatment compliance:  Untreated Relieved by:  Nothing Worsened by:  Nothing Ineffective treatments:  None tried Associated symptoms: irritability    Pt reports he is having hallucinations.  Pt states he lashes out at the hallucinations and this scares his Mother and his sister.  Pt reports he wants the hallucinations to stop.  Pt's family is concerned that he will harm someone when he has bad hallucinations  Past Medical History:  Diagnosis Date  . Depression   . Emphysema with chronic bronchitis (HCC)   . Frequent headaches   . Hallucinations     There are no active problems to display for this patient.   No past surgical history on file.      Home Medications    Prior to Admission medications   Medication Sig Start Date End Date Taking? Authorizing Provider  aspirin 81 MG chewable tablet Chew by mouth. 12/30/18 01/29/19  [provider]  clotrimazole-betamethasone (LOTRISONE) cream Apply topically. 12/28/18 01/27/19  [provider]  lithium carbonate (LITHOBID) 300 MG CR tablet Take by mouth. 12/29/18   [provider]  Melatonin 3 MG TABS Take by mouth. 09/23/18   [provider]  OLANZapine (ZYPREXA) 20 MG tablet Take 1 tablet (20 mg total) by mouth at bedtime. 12/20/18   Cottle, Steva Ready., MD  Oxcarbazepine (TRILEPTAL) 300 MG tablet Take 2 tablets every morning and 2 in the evening Patient taking  differently: Take 600 mg by mouth every evening.  12/13/18   Cottle, Steva Ready., MD  traZODone (DESYREL) 50 MG tablet Take 1 tablet (50 mg total) by mouth at bedtime. 11/08/18   Cottle, Steva Ready., MD    Family History Family History  Problem Relation Age of Onset  . Hyperlipidemia Mother   . COPD Father   . Cancer Father   . Depression Brother   . Drug abuse Brother   . Early death Brother   . Mental illness Brother   . Depression Sister     Social History Social History   Tobacco Use  . Smoking status: Current Every Day Smoker    Types: Cigarettes  . Smokeless tobacco: Never Used  Substance Use Topics  . Alcohol use: Not Currently  . Drug use: Never     Allergies   Lithium, Seroquel [quetiapine fumarate], and Aripiprazole   Review of Systems Review of Systems  Constitutional: Positive for irritability.  Psychiatric/Behavioral: Positive for agitation and hallucinations.  All other systems reviewed and are negative.    Physical Exam Updated Vital Signs BP (!) 146/100 (BP Location: Left Arm)   Pulse 98   Temp 98.7 F (37.1 C) (Oral)   Resp 17   SpO2 98%   Physical Exam Vitals signs and nursing note reviewed.  Constitutional:      Appearance: He is well-developed.  HENT:     Head: Normocephalic and atraumatic.     Mouth/Throat:     Mouth: Mucous membranes  are moist.  Eyes:     Conjunctiva/sclera: Conjunctivae normal.  Neck:     Musculoskeletal: Neck supple.  Cardiovascular:     Rate and Rhythm: Normal rate and regular rhythm.     Heart sounds: No murmur.  Pulmonary:     Effort: Pulmonary effort is normal. No respiratory distress.     Breath sounds: Normal breath sounds.  Abdominal:     Palpations: Abdomen is soft.     Tenderness: There is no abdominal tenderness.  Musculoskeletal: Normal range of motion.  Skin:    General: Skin is warm and dry.  Neurological:     General: No focal deficit present.     Mental Status: He is alert.   Psychiatric:        Mood and Affect: Mood normal.      ED Treatments / Results  Labs (all labs ordered are listed, but only abnormal results are displayed) Labs Reviewed  CBC WITH DIFFERENTIAL/PLATELET - Abnormal; Notable for the following components:      Result Value   Abs Immature Granulocytes 0.09 (*)    All other components within normal limits  COMPREHENSIVE METABOLIC PANEL - Abnormal; Notable for the following components:   Glucose, Bld 106 (*)    All other components within normal limits  ETHANOL  RAPID URINE DRUG SCREEN, HOSP PERFORMED    EKG None  Radiology No results found.  Procedures Procedures (including critical care time)  Medications Ordered in ED Medications - No data to display   Initial Impression / Assessment and Plan / ED Course  I have reviewed the triage vital signs and the nursing notes.  Pertinent labs & imaging results that were available during my care of the patient were reviewed by me and considered in my medical decision making (see chart for details).        MDM  Pt medically clear,  TTS consulted.  TTS advised overnight observation.  They will reassess in am.   Final Clinical Impressions(s) / ED Diagnoses   Final diagnoses:  Hallucinations    ED Discharge Orders    None       Sidney Ace 01/04/19 1658    Hayden Rasmussen, MD 01/04/19 1948    Fransico Meadow, PA-C 01/04/19 2027    Hayden Rasmussen, MD 01/04/19 2044

## 2019-01-05 DIAGNOSIS — F329 Major depressive disorder, single episode, unspecified: Secondary | ICD-10-CM

## 2019-01-05 DIAGNOSIS — F319 Bipolar disorder, unspecified: Secondary | ICD-10-CM

## 2019-01-05 MED ORDER — DIPHENHYDRAMINE HCL 25 MG PO CAPS
25.0000 mg | ORAL_CAPSULE | Freq: Once | ORAL | Status: AC
  Administered 2019-01-05: 25 mg via ORAL
  Filled 2019-01-05: qty 1

## 2019-01-05 NOTE — Discharge Instructions (Signed)
For your behavioral health needs, you are advised to continue treatment with Lynder Parents, MD:       [place]      [address] [place], [place] [address]      [phone]

## 2019-01-05 NOTE — ED Notes (Signed)
Patient restless and wants something to help sleep.

## 2019-01-05 NOTE — Consult Note (Signed)
[facility] Psych ED Discharge  01/05/2019 12:25 PM William Frye  MRN:  [id number] Principal Problem: Bipolar 1 disorder [facility]) Discharge Diagnoses: Principal Problem:   Bipolar 1 disorder (HCC) Active Problems:   MDD (major depressive disorder)   Subjective: Patient alert and oriented for assessment, answers appropriately. Patient denies suicidal and homicidal ideations. Patient denies acces to weapons, lives alternately with Mother and sister.  Patient denies paranoia. Patient endorses "sometimes I talk to people who aren't there, like a psychotic acid trip." Patient reports these hallucinations began "a couple months ago."  Patient denies command hallucinations. Patient released from inpatient at [facility] 10 days ago, followed outpatient by Dr Haywood Lasso outpatient.  .  Collateral from sister, [name]: "My brother was hospitalized in July of this year at [facility] and 2 weeks ago at [facility]. Our mother is 25 years old and we can't handle him anymore. My family would like for him to be placed in a long-term care facility. We tried to tell [facility] hospital that he is not able to care for himself and needs assisted living." Sister reports "He is not suicidal and he is not homicidal, he just wants to stay in bed all the time."   Total Time spent with patient: 30 minutes  Past Psychiatric History: MDD, Bipolar Disorder  Past Medical History:  Past Medical History:  Diagnosis Date  . Depression   . Emphysema with chronic bronchitis (HCC)   . Frequent headaches   . Hallucinations    No past surgical history on file. Family History:  Family History  Problem Relation Age of Onset  . Hyperlipidemia Mother   . COPD Father   . Cancer Father   . Depression Brother   . Drug abuse Brother   . Early death Brother   . Mental illness Brother   . Depression Sister    Family Psychiatric  History: Brother- Depression Social History:  Social History   Substance and Sexual Activity  Alcohol  Use Not Currently     Social History   Substance and Sexual Activity  Drug Use Never    Social History   Socioeconomic History  . Marital status: Divorced    Spouse name: Not on file  . Number of children: Not on file  . Years of education: Not on file  . Highest education level: Not on file  Occupational History  . Not on file  Social Needs  . Financial resource strain: Not on file  . Food insecurity    Worry: Not on file    Inability: Not on file  . Transportation needs    Medical: Not on file    Non-medical: Not on file  Tobacco Use  . Smoking status: Current Every Day Smoker    Types: Cigarettes  . Smokeless tobacco: Never Used  Substance and Sexual Activity  . Alcohol use: Not Currently  . Drug use: Never  . Sexual activity: Not Currently    Partners: Female  Lifestyle  . Physical activity    Days per week: Not on file    Minutes per session: Not on file  . Stress: Not on file  Relationships  . Social Musician on phone: Not on file    Gets together: Not on file    Attends religious service: Not on file    Active member of club or organization: Not on file    Attends meetings of clubs or organizations: Not on file    Relationship status: Not on file  Other Topics Concern  . Not on file  Social History Narrative  . Not on file    Has this patient used any form of tobacco in the last 30 days? (Cigarettes, Smokeless Tobacco, Cigars, and/or Pipes) A prescription for an FDA-approved tobacco cessation medication was offered at discharge and the patient refused  Current Medications: No current facility-administered medications for this encounter.    Current Outpatient Medications  Medication Sig Dispense Refill  . aspirin 81 MG chewable tablet Chew 81 mg by mouth daily.     Marland Kitchen lithium carbonate (LITHOBID) 300 MG CR tablet Take by mouth.    . OLANZapine (ZYPREXA) 20 MG tablet Take 1 tablet (20 mg total) by mouth at bedtime. 30 tablet 1  .  Oxcarbazepine (TRILEPTAL) 300 MG tablet Take 2 tablets every morning and 2 in the evening (Patient taking differently: Take 600 mg by mouth every evening. ) 120 tablet 1  . traZODone (DESYREL) 50 MG tablet Take 1 tablet (50 mg total) by mouth at bedtime. 30 tablet 1   PTA Medications: (Not in a hospital admission)   Musculoskeletal: Strength & Muscle Tone: within normal limits Gait & Station: normal Patient leans: N/A  Psychiatric Specialty Exam: Physical Exam  ROS  Blood pressure (!) 126/92, pulse 75, temperature 98.1 F (36.7 C), temperature source Oral, resp. rate 17, SpO2 96 %.There is no height or weight on file to calculate BMI.  General Appearance: Casual  Eye Contact:  Good  Speech:  Clear and Coherent and Normal Rate  Volume:  Normal  Mood:  Anxious  Affect:  Appropriate and Congruent  Thought Process:  Coherent, Goal Directed and Descriptions of Associations: Intact  Orientation:  Full (Time, Place, and Person)  Thought Content:  WDL and Logical  Suicidal Thoughts:  No  Homicidal Thoughts:  No  Memory:  Immediate;   Good Recent;   Good Remote;   Good  Judgement:  Fair  Insight:  Fair  Psychomotor Activity:  Normal  Concentration:  Concentration: Good and Attention Span: Good  Recall:  Good  Fund of Knowledge:  Good  Language:  Good  Akathisia:  No  Handed:  Right  AIMS (if indicated):     Assets:  Communication Skills Desire for Improvement Financial Resources/Insurance Housing Social Support  ADL's:  Intact  Cognition:  WNL  Sleep:        Demographic Factors:  Male and NA  Loss Factors: NA  Historical Factors: Prior suicide attempts  Risk Reduction Factors:   Sense of responsibility to family, Employed, Living with another person, especially a relative, Positive social support, Positive therapeutic relationship and Positive coping skills or problem solving skills  Continued Clinical Symptoms:  Bipolar Disorder:   Mixed State  Cognitive  Features That Contribute To Risk:  None    Suicide Risk:  Minimal: No identifiable suicidal ideation.  Patients presenting with no risk factors but with morbid ruminations; may be classified as minimal risk based on the severity of the depressive symptoms    Plan Of Care/Follow-up recommendations:  Other:  Follow up with outpatient provider  Disposition: Discharge home  Emmaline Kluver, [place] 01/05/2019, 12:25 PM

## 2019-01-05 NOTE — Progress Notes (Signed)
Received William Frye from [facility] and he immediately went to bed. He slept for a short period of time before he was awake and restless. He is anxious to talk with the doctor this morning related to being discharge.

## 2019-01-05 NOTE — BH Assessment (Signed)
[facility] Assessment Progress Note  Per Letitia Libra, FNP, this pt does not require psychiatric hospitalization at this time.  Pt is to be discharged from [facility] with recommendation to continue treatment with Lynder Parents, MD.  This has been included in pt's discharge instructions.  Pt's nurse, Eustaquio Maize, has been notified.  Jalene Mullet, [place] Triage Specialist [phone]

## 2019-01-05 NOTE — ED Notes (Signed)
Pt off unit to home per provider. Pt alert, calm, cooperative, no s/s of distress.  DC information given to and reviewed with pt, with acknowledged understanding. Belongings given to pt. Pt ambulatory off the unit, escorted by [facility]. Pt transported by family.

## 2019-01-05 NOTE — ED Notes (Signed)
Pt disorganized, talking to self, redirectable.

## 2019-01-05 NOTE — Progress Notes (Addendum)
12:50p Patient's RN spoke with patient's sister, CSW was present during the conversation. Per patient's daughter, she will be coming to pick patient up, but are an hour and a half away. Patient to be ready when they arrive. Patient's sister instructed on picking patient up from [facility] ED.  11:38a CSW spoke with patient's sister, [name] [phone]) via phone who requested to speak with CSW about patient needing somewhere to go. Per [name], she does not feel she can take care of patient with his [place] problems. She reports patient has bipolar disorder and she find it difficult to manage his highs and lows, not taking care of his ADLs, and being independent in taking his medication. [name] reports patient has been living with her or patient's mother. [name] reports she would like patient to be placed in a long-term care facility. CSW explained with do not do placement for long-term care from the ED. CSW offered to provide the family with resources to get the process started, but again explained patient will have to return to home or we can attempt to find him a shelter, but shelters beds are not guaranteed right now. [name] reports she will speak with the family to come up with a plan and get back to CSW. Still waiting on discharge orders for patient.   [place]en Circle, LCSW Transitions of Care Department [facility] ED [phone]

## 2019-01-13 ENCOUNTER — Other Ambulatory Visit: Payer: Self-pay

## 2019-01-13 ENCOUNTER — Ambulatory Visit (INDEPENDENT_AMBULATORY_CARE_PROVIDER_SITE_OTHER): Payer: Self-pay | Admitting: Psychiatry

## 2019-01-13 ENCOUNTER — Encounter: Payer: Self-pay | Admitting: Psychiatry

## 2019-01-13 DIAGNOSIS — F319 Bipolar disorder, unspecified: Secondary | ICD-10-CM

## 2019-01-13 MED ORDER — OLANZAPINE 15 MG PO TABS: 15.0000 mg | ORAL_TABLET | Freq: Every day | ORAL | 1 refills | Status: DC

## 2019-01-13 NOTE — Patient Instructions (Addendum)
Encourage more physical activity   At next refill reduce olanzapine to 15 mg nightly to help with alertness and energy

## 2019-01-13 NOTE — Progress Notes (Signed)
SAVVAS ROPER [id number] 1956-01-06 63 y.o.     Subjective:   Patient ID:  William Frye is a 63 y.o. (DOB [birth year]) male.  Chief Complaint:  Chief Complaint  Patient presents with  . Follow-up    Medication Management  . Depression    Medication Management  . Other    Bipolar  . Medication Refill    Zyprexa    Anxiety Patient reports no confusion.    Depression        Associated symptoms include fatigue.  Past medical history includes anxiety.   Medication Refill Associated symptoms include fatigue. Pertinent negatives include no weakness.   [name] presents to the office today for follow-up of bipolar psychosis and recent hospitalization.   Seen with sister today.  At visit August 25, 2018.  He had stopped lithium on his own because of complaints of diarrhea.  We had to select an alternative mood stabilizer and picked Abilify 15 mg daily. He is called multiple times since that visit.  Or else his sister has called for him.  On July 6 that his sister called stating he was having new onset auditory hallucinations.  It was suggested that he increase Abilify to get a better antipsychotic effect at 22.5 mg daily.  They called back again on July 13 stating that he was still having auditory and visual hallucinations.  Abilify was discontinued and recommendation was to start risperidone 3 mg nightly.  His sister called again on July 17 stating that he was not sleeping and still hallucinating and was disoriented.  At that point hospitalization was recommended.  That day he was admitted to [facility] psychiatric unit and continued on risperidone.  He was discharged on risperidone 3 mg nightly quetiapine 100 mg nightly for sleep and buspirone 15 mg twice daily.  He was discharged July 23.  The psychiatric resident there Dr. Nedra Hai indicated his psychosis had improved.  At visit August 24 he was very frustrated with risperidone feeling like a "zombie" and  wanted to stop all medications.  He was strongly encouraged not to do so because of the recent psychosis.  He agreed to a dosage reduction from 3 mg to 2 mg daily to reduce side effects.  At his visit November 04, 2018 the decision was made to make the following changes: After [name] returns from trip, Reduce risperidone to 1 mg at night and start oxcarbazepine (Trileptal) 1/2 twice daily for 1 week, Then 1 twice daily.  At his visit December 06, 2018 the following changes were made: DC risperidone DT SE Start olanzapine 5mg  pm as bridge until can optimize the Trileptal dosing. Increase Trileptal to 1 1/2 tablets in the morning and 2 in the evening.  Had another psych hosp October 21 and December 25, 2018 for psychosis.  Spoke with Dr. December 27, 2018 about his care.  She thought it wise to restart a low-dose of lithium because he had had improvement with lithium but had diarrhea. He was discharged on lithium 300 mg nightly Oxcarbazepine 600 mg nightly Trazodone 50 mg nightly as needed Olanzapine increased to 20 mg nightly for psychosis  At the last visit December 30, 2018 the decision was made to wean oxcarbazepine because of minimal benefit and potential drug interactions and complaint about balance problems.  He was continued on olanzapine 20 mg daily.   Change for the better.  Anxiety is way better.  Still a little wobbly but probably better.  Still spending a  lot of time in bed.  Watch TV a little.  Sleep 8 hours.  No major episodes of confusion per pt. Appetite reduced.  Lost 10# in 2 weeks.  Better diet bc living with sister.  No hallucinations.  Still depressed and over the world.  No motivation and letting sister take care of him.  Per sister first night off Trileptal hallucination and confusion and went to ER.  No further hallucinations.    Slept well.  No fearful thoughts but concerns over recent psychosis.    Depression still there like a blanket.  PTH D noted he was hyperactive and  hyperverbal and talking to himself and having AH and VH.  No SI.  Currently no SE except weak in legs and poor balance.  No diarrhea.  Sister notes worry he might fall.  Shuffle.  Groggy and wants to sleep a lot.  She notes poor concentration and memory.    A struggle to make himself walk.  Staying with Dianne.   She sees him as flat.    Past psychiatric medications include Depakote and Prozac NR.   Seroquel side effects,  Abilify worse..   Lithium diarrhea.  Risperidone 3. Oxcarbazepine questionable balance problems Olanzapine 20 buspirone.  NO FURTHER PARTIAL DOPAMINE AGONIST TRIALS UNLESS ABSOLUTELY NECESSARY DT POOR RESPONSE FROM ARIPIPRAZOLE.  Review of Systems:  Review of Systems  Constitutional: Positive for fatigue.  Gastrointestinal: Negative for constipation and diarrhea.  Neurological: Negative for tremors and weakness.  Psychiatric/Behavioral: Positive for agitation and depression. Negative for behavioral problems, confusion and hallucinations.       Fidgety    Medications: I have reviewed the patient's current medications.  Current Outpatient Medications  Medication Sig Dispense Refill  . aspirin 81 MG chewable tablet Chew 81 mg by mouth daily.     Marland Kitchen lithium carbonate (LITHOBID) 300 MG CR tablet Take by mouth.    . OLANZapine (ZYPREXA) 15 MG tablet Take 1 tablet (15 mg total) by mouth at bedtime. 30 tablet 1  . traZODone (DESYREL) 50 MG tablet Take 1 tablet (50 mg total) by mouth at bedtime. 30 tablet 1   No current facility-administered medications for this visit.     Medication Side Effects: None  Allergies:  Allergies  Allergen Reactions  . Lithium Diarrhea and Nausea Only  . Seroquel [Quetiapine Fumarate]     Psychosis  . Aripiprazole Other (See Comments)    Auditory and visual hallucinations    Past Medical History:  Diagnosis Date  . Depression   . Emphysema with chronic bronchitis (HCC)   . Frequent headaches   . Hallucinations     Family  History  Problem Relation Age of Onset  . Hyperlipidemia Mother   . COPD Father   . Cancer Father   . Depression Brother   . Drug abuse Brother   . Early death Brother   . Mental illness Brother   . Depression Sister     Social History   Socioeconomic History  . Marital status: Divorced    Spouse name: Not on file  . Number of children: Not on file  . Years of education: Not on file  . Highest education level: Not on file  Occupational History  . Not on file  Social Needs  . Financial resource strain: Not on file  . Food insecurity    Worry: Not on file    Inability: Not on file  . Transportation needs    Medical: Not on file  Non-medical: Not on file  Tobacco Use  . Smoking status: Current Every Day Smoker    Types: Cigarettes  . Smokeless tobacco: Never Used  Substance and Sexual Activity  . Alcohol use: Not Currently  . Drug use: Never  . Sexual activity: Not Currently    Partners: Female  Lifestyle  . Physical activity    Days per week: Not on file    Minutes per session: Not on file  . Stress: Not on file  Relationships  . Social Herbalist on phone: Not on file    Gets together: Not on file    Attends religious service: Not on file    Active member of club or organization: Not on file    Attends meetings of clubs or organizations: Not on file    Relationship status: Not on file  . Intimate partner violence    Fear of current or ex partner: Not on file    Emotionally abused: Not on file    Physically abused: Not on file    Forced sexual activity: Not on file  Other Topics Concern  . Not on file  Social History Narrative  . Not on file    Past Medical History, Surgical history, Social history, and Family history were reviewed and updated as appropriate.   Please see review of systems for further details on the patient's review from today.   Objective:   Physical Exam:  There were no vitals taken for this visit.  Physical  Exam Constitutional:      General: He is not in acute distress.    Appearance: Normal appearance. He is well-developed. He is obese.  Musculoskeletal:        General: No deformity.  Neurological:     Mental Status: He is alert and oriented to person, place, and time.     Coordination: Coordination normal.  Psychiatric:        Attention and Perception: He is attentive. He does not perceive auditory hallucinations.        Mood and Affect: Mood is depressed. Mood is not anxious. Affect is not labile, blunt, angry or inappropriate.        Speech: Speech normal.        Behavior: Behavior normal.        Thought Content: Thought content normal. Thought content does not include homicidal or suicidal ideation. Thought content does not include homicidal or suicidal plan.        Cognition and Memory: Cognition normal.        Judgment: Judgment is not impulsive.     Comments: Insight fair . No auditory or visual hallucinations. No delusions. He is not markedly anxious.       Lab Review:     Component Value Date/Time   NA 139 01/04/2019 1336   K 4.1 01/04/2019 1336   CL 107 01/04/2019 1336   CO2 22 01/04/2019 1336   GLUCOSE 106 (H) 01/04/2019 1336   BUN 20 01/04/2019 1336   CREATININE 1.04 01/04/2019 1336   CALCIUM 9.2 01/04/2019 1336   PROT 7.6 01/04/2019 1336   ALBUMIN 4.0 01/04/2019 1336   AST 19 01/04/2019 1336   ALT 22 01/04/2019 1336   ALKPHOS 124 01/04/2019 1336   BILITOT 0.9 01/04/2019 1336   GFRNONAA >60 01/04/2019 1336   GFRAA >60 01/04/2019 1336       Component Value Date/Time   WBC 6.9 01/04/2019 1336   RBC 5.33 01/04/2019 1336   HGB  16.0 01/04/2019 1336   HCT 49.8 01/04/2019 1336   PLT 236 01/04/2019 1336   MCV 93.4 01/04/2019 1336   MCH 30.0 01/04/2019 1336   MCHC 32.1 01/04/2019 1336   RDW 13.3 01/04/2019 1336   LYMPHSABS 1.0 01/04/2019 1336   MONOABS 0.6 01/04/2019 1336   EOSABS 0.2 01/04/2019 1336   BASOSABS 0.1 01/04/2019 1336    Lithium Lvl  Date  Value Ref Range Status  07/07/2018 0.9 0.6 - 1.2 mmol/L Final     No results found for: PHENYTOIN, PHENOBARB, VALPROATE, CBMZ   .res Assessment: Plan:    Onalee HuaDavid was seen today for follow-up, depression, other and medication refill.  Diagnoses and all orders for this visit:  Bipolar I disorder (HCC) -     OLANZapine (ZYPREXA) 15 MG tablet; Take 1 tablet (15 mg total) by mouth at bedtime.  Bipolar disorder currently depressed with recent psychotic features.  Greater than 50% of 35-minute face to face time with patient and his sister was spent on counseling and coordination of care. We discussed Onalee HuaDavid has had an unusual pattern of psychiatric symptoms with severe instability over the last several weeks.  He had been reasonably stable on lithium with regard to his mood disorder but abruptly stopped it due to complaints about diarrhea.  At his follow-up visit in June we had discussion about alternative mood stabilizers.  Abilify 15 mg was prescribed.  Subsequently he became very psychotic without obvious mania.  The psychosis required hospitalization.  It resolved with 3 mg of risperidone.  No longer has complaints about the medication.  He admits he still depressed but is greatly relieved that his anxiety is markedly improved with the olanzapine.  However he does not want to do anything except lay in bed.  Not interested in listening to music anymore which is unusual for him.  No further partial DA agonist.  Because of the potential adverse reaction to Abilify we will avoid the use of other partial dopamine agonist such as Vraylar or Rexulti.  Reduce olanzapine to 15 mg at next refill bc sedation. Continue lithium 300 mg for the neuroprotective effect and potentially mild mood effect.  Consider increasing at some point in the future but we need to avoid the problem with diarrhea that he had a normal dosage.Marland Kitchen.   Option add Wellbutrin.  Discussed the pros and cons of doing this versus reducing the  olanzapine.  It is my opinion that he is depressed and reducing the olanzapine is probably not going to fix that in the short-term.  Discussed other ways of improving depression such as physical exercise and increasing mental stimulation and light.  Option counseling.  He's not motivated.    He agrees to the plan Hold trazodone  Disc light therapy and fish oil in detail and gave handouts.  Follow-up 4-6  weeks  Iona Hansenarey, MD, DFAPA  Future Appointments  Date Time Provider Department Center  03/01/2019  1:30 PM Cottle, Steva Readyarey G Jr., MD [facility] None    No orders of the defined types were placed in this encounter.     -------------------------------

## 2019-01-31 ENCOUNTER — Other Ambulatory Visit: Payer: Self-pay

## 2019-01-31 MED ORDER — LITHIUM CARBONATE ER 300 MG PO TBCR: 300.0000 mg | EXTENDED_RELEASE_TABLET | Freq: Every day | ORAL | 1 refills | Status: DC

## 2019-03-01 ENCOUNTER — Other Ambulatory Visit: Payer: Self-pay

## 2019-03-01 ENCOUNTER — Encounter: Payer: Self-pay | Admitting: Psychiatry

## 2019-03-01 ENCOUNTER — Ambulatory Visit (INDEPENDENT_AMBULATORY_CARE_PROVIDER_SITE_OTHER): Payer: Self-pay | Admitting: Psychiatry

## 2019-03-01 VITALS — Wt 264.0 lb

## 2019-03-01 DIAGNOSIS — F319 Bipolar disorder, unspecified: Secondary | ICD-10-CM

## 2019-03-01 DIAGNOSIS — F411 Generalized anxiety disorder: Secondary | ICD-10-CM

## 2019-03-01 MED ORDER — LITHIUM CARBONATE 600 MG PO CAPS: 600.0000 mg | ORAL_CAPSULE | Freq: Every day | ORAL | 0 refills | Status: DC

## 2019-03-01 MED ORDER — OLANZAPINE 15 MG PO TABS: 15.0000 mg | ORAL_TABLET | Freq: Every day | ORAL | 0 refills | Status: DC

## 2019-03-01 NOTE — Progress Notes (Signed)
[name] [id number] 05/30/1955 63 y.o.     Subjective:   Patient ID:  William Frye is a 63 y.o. (DOB 08/02/1955) male.  Chief Complaint:  Chief Complaint  Patient presents with  . Follow-up     Medciation Management  . Other    Bipolar 1  . Depression    Depression        Associated symptoms include fatigue.  Past medical history includes anxiety.   Anxiety Patient reports no confusion.    Medication Refill Associated symptoms include fatigue. Pertinent negatives include no weakness.   William Frye presents to the office today for follow-up of bipolar psychosis and recent hospitalization.   Seen with sister today.  At visit August 25, 2018.  He had stopped lithium on his own because of complaints of diarrhea.  We had to select an alternative mood stabilizer and picked Abilify 15 mg daily. He is called multiple times since that visit.  Or else his sister has called for him.  On July 6 that his sister called stating he was having new onset auditory hallucinations.  It was suggested that he increase Abilify to get a better antipsychotic effect at 22.5 mg daily.  They called back again on July 13 stating that he was still having auditory and visual hallucinations.  Abilify was discontinued and recommendation was to start risperidone 3 mg nightly.  His sister called again on July 17 stating that he was not sleeping and still hallucinating and was disoriented.  At that point hospitalization was recommended.  That day he was admitted to [facility] psychiatric unit and continued on risperidone.  He was discharged on risperidone 3 mg nightly quetiapine 100 mg nightly for sleep and buspirone 15 mg twice daily.  He was discharged July 23.  The psychiatric resident there Dr. Nedra Hai indicated his psychosis had improved.  At visit August 24 he was very frustrated with risperidone feeling like a "zombie" and wanted to stop all medications.  He was strongly  encouraged not to do so because of the recent psychosis.  He agreed to a dosage reduction from 3 mg to 2 mg daily to reduce side effects.  At his visit November 04, 2018 the decision was made to make the following changes: After [name] returns from trip, Reduce risperidone to 1 mg at night and start oxcarbazepine (Trileptal) 1/2 twice daily for 1 week, Then 1 twice daily.  At his visit December 06, 2018 the following changes were made: DC risperidone DT SE Start olanzapine 5mg  pm as bridge until can optimize the Trileptal dosing. Increase Trileptal to 1 1/2 tablets in the morning and 2 in the evening.  Had another psych hosp October 21 and December 25, 2018 for psychosis.  Spoke with Dr. December 27, 2018 about his care.  She thought it wise to restart a low-dose of lithium because he had had improvement with lithium but had diarrhea. He was discharged on lithium 300 mg nightly Oxcarbazepine 600 mg nightly Trazodone 50 mg nightly as needed Olanzapine increased to 20 mg nightly for psychosis  At the visit December 30, 2018 the decision was made to wean oxcarbazepine because of minimal benefit and potential drug interactions and complaint about balance problems.  He was continued on olanzapine 20 mg daily.  At the last visit he was still lethargic and unmotivated as noted previously.  Olanzapine was reduced to 15 mg daily.  There was consideration given for adding Wellbutrin for presumed depression but they  did not wish to pursue that at the time.  Seen with sister today as usual.  All he wants to do is lie around in bed.  No more anxious.  Depression 3/10.   Sister says he used  To be better.  No major agitation.  At least 8 hours sleep without problems   Change for the better.  Anxiety is way better.  Still a little wobbly but probably better.  Still spending a lot of time in bed.  Watch TV a little.  Sleep 8 hours.  No major episodes of confusion per pt. Appetite reduced.  Lost 10# in 2 weeks.  Better  diet bc living with sister.  No hallucinations.  Still depressed and over the world.  No motivation and letting sister take care of him.  Per sister first night off Trileptal hallucination and confusion and went to ER.  No further hallucinations.    Slept well.  No fearful thoughts but concerns over recent psychosis.    Depression still there like a blanket.  PTH D noted he was hyperactive and hyperverbal and talking to himself and having AH and VH.  No SI.  Currently no SE except weak in legs and poor balance.  No diarrhea.  Sister notes worry he might fall.  Shuffle.  Groggy and wants to sleep a lot.  She notes poor concentration and memory.    A struggle to make himself walk.  Staying with Dianne.   She sees him as flat.    Past psychiatric medications include Depakote and Prozac NR.   Seroquel side effects,  Abilify worse..   Lithium diarrhea.  Risperidone 3. Oxcarbazepine questionable balance problems Olanzapine 20 buspirone.  NO FURTHER PARTIAL DOPAMINE AGONIST TRIALS UNLESS ABSOLUTELY NECESSARY DT POOR RESPONSE FROM ARIPIPRAZOLE.  Review of Systems:  Review of Systems  Constitutional: Positive for fatigue.  Gastrointestinal: Negative for constipation and diarrhea.  Neurological: Negative for tremors and weakness.  Psychiatric/Behavioral: Positive for agitation and depression. Negative for behavioral problems, confusion and hallucinations.       Fidgety    Medications: I have reviewed the patient's current medications.  Current Outpatient Medications  Medication Sig Dispense Refill  . lithium carbonate (LITHOBID) 300 MG CR tablet Take 1 tablet (300 mg total) by mouth at bedtime. 120 tablet 1  . OLANZapine (ZYPREXA) 15 MG tablet Take 1 tablet (15 mg total) by mouth at bedtime. 90 tablet 0  . traZODone (DESYREL) 50 MG tablet Take 1 tablet (50 mg total) by mouth at bedtime. 30 tablet 1  . lithium 600 MG capsule Take 1 capsule (600 mg total) by mouth daily. 90 capsule 0   No  current facility-administered medications for this visit.    Medication Side Effects: None  Allergies:  Allergies  Allergen Reactions  . Lithium Diarrhea and Nausea Only  . Seroquel [Quetiapine Fumarate]     Psychosis  . Aripiprazole Other (See Comments)    Auditory and visual hallucinations    Past Medical History:  Diagnosis Date  . Depression   . 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 Frequent headaches   . Hallucinations     Family History  Problem Relation Age of Onset  . Hyperlipidemia Mother   . COPD Father   . Cancer Father   . Depression Brother   . Drug abuse Brother   . Early death Brother   . Mental illness Brother   . Depression Sister     Social History   Socioeconomic History  .  Marital status: Divorced    Spouse name: Not on file  . Number of children: Not on file  . Years of education: Not on file  . Highest education level: Not on file  Occupational History  . Not on file  Tobacco Use  . Smoking status: Current Every Day Smoker    Types: Cigarettes  . Smokeless tobacco: Never Used  Substance and Sexual Activity  . Alcohol use: Not Currently  . Drug use: Never  . Sexual activity: Not Currently    Partners: Female  Other Topics Concern  . Not on file  Social History Narrative  . Not on file   Social Determinants of Health   Financial Resource Strain:   . Difficulty of Paying Living Expenses: Not on file  Food Insecurity:   . Worried About Programme researcher, broadcasting/film/video in the Last Year: Not on file  . Ran Out of Food in the Last Year: Not on file  Transportation Needs:   . Lack of Transportation (Medical): Not on file  . Lack of Transportation (Non-Medical): Not on file  Physical Activity:   . Days of Exercise per Week: Not on file  . Minutes of Exercise per Session: Not on file  Stress:   . Feeling of Stress : Not on file  Social Connections:   . Frequency of Communication with Friends and Family: Not on file  . Frequency of  Social Gatherings with Friends and Family: Not on file  . Attends Religious Services: Not on file  . Active Member of Clubs or Organizations: Not on file  . Attends Banker Meetings: Not on file  . Marital Status: Not on file  Intimate Partner Violence:   . Fear of Current or Ex-Partner: Not on file  . Emotionally Abused: Not on file  . Physically Abused: Not on file  . Sexually Abused: Not on file    Past Medical History, Surgical history, Social history, and Family history were reviewed and updated as appropriate.   Please see review of systems for further details on the patient's review from today.   Objective:   Physical Exam:  Wt 264 lb (119.7 kg)   BMI 33.00 kg/m   Physical Exam Constitutional:      General: He is not in acute distress.    Appearance: Normal appearance. He is well-developed. He is obese.  Musculoskeletal:        General: No deformity.  Neurological:     Mental Status: He is alert and oriented to person, place, and time.     Coordination: Coordination normal.     Comments: No cogwheel rigidity.  Psychiatric:        Attention and Perception: He is attentive. He does not perceive auditory hallucinations.        Mood and Affect: Mood is depressed. Mood is not anxious. Affect is not labile, blunt, angry or inappropriate.        Speech: Speech normal.        Behavior: Behavior normal.        Thought Content: Thought content normal. Thought content does not include homicidal or suicidal ideation. Thought content does not include homicidal or suicidal plan.        Cognition and Memory: Cognition normal.        Judgment: Judgment is not impulsive.     Comments: Insight fair . No auditory or visual hallucinations. No delusions. He is not markedly anxious.       Lab Review:  Component Value Date/Time   NA 139 01/04/2019 1336   K 4.1 01/04/2019 1336   CL 107 01/04/2019 1336   CO2 22 01/04/2019 1336   GLUCOSE 106 (H) 01/04/2019 1336    BUN 20 01/04/2019 1336   CREATININE 1.04 01/04/2019 1336   CALCIUM 9.2 01/04/2019 1336   PROT 7.6 01/04/2019 1336   ALBUMIN 4.0 01/04/2019 1336   AST 19 01/04/2019 1336   ALT 22 01/04/2019 1336   ALKPHOS 124 01/04/2019 1336   BILITOT 0.9 01/04/2019 1336   GFRNONAA >60 01/04/2019 1336   GFRAA >60 01/04/2019 1336       Component Value Date/Time   WBC 6.9 01/04/2019 1336   RBC 5.33 01/04/2019 1336   HGB 16.0 01/04/2019 1336   HCT 49.8 01/04/2019 1336   PLT 236 01/04/2019 1336   MCV 93.4 01/04/2019 1336   MCH 30.0 01/04/2019 1336   MCHC 32.1 01/04/2019 1336   RDW 13.3 01/04/2019 1336   LYMPHSABS 1.0 01/04/2019 1336   MONOABS 0.6 01/04/2019 1336   EOSABS 0.2 01/04/2019 1336   BASOSABS 0.1 01/04/2019 1336    Lithium Lvl  Date Value Ref Range Status  07/07/2018 0.9 0.6 - 1.2 mmol/L Final     No results found for: PHENYTOIN, PHENOBARB, VALPROATE, CBMZ   .res Assessment: Plan:    William Frye was seen today for follow-up, other and depression.  Diagnoses and all orders for this visit:  Generalized anxiety disorder  Bipolar I disorder (HCC) -     OLANZapine (ZYPREXA) 15 MG tablet; Take 1 tablet (15 mg total) by mouth at bedtime. -     lithium 600 MG capsule; Take 1 capsule (600 mg total) by mouth daily.     Bipolar disorder currently depressed with recent psychotic features.  Greater than 50% of 35-minute face to face time with patient and his sister was spent on counseling and coordination of care. We discussed William Frye has had an unusual pattern of psychiatric symptoms with severe instability over the last several weeks.  He had been reasonably stable on lithium with regard to his mood disorder but abruptly stopped it due to complaints about diarrhea.  At his follow-up visit in June we had discussion about alternative mood stabilizers.  Abilify 15 mg was prescribed.  Subsequently he became very psychotic without obvious mania.  The psychosis required hospitalization.  It resolved  with 3 mg of risperidone.  No longer has complaints about the medication.  He admits he still depressed but is greatly relieved that his anxiety is markedly improved with the olanzapine.  However he does not want to do anything except lay in bed.  Not interested in listening to music anymore which is unusual for him.  No further partial DA agonist.  Because of the potential adverse reaction to Abilify we will avoid the use of other partial dopamine agonist such as Vraylar or Rexulti.  Continue olanzapine to 15 mg.  The last reduction did not help energy, motivation , nor interest.  Increase lithium to 600 mg for the neuroprotective effect and potentially mild mood effect and should solve constipation.  Hopefully this will help his depression.  Consider increasing at some point in the future but we need to avoid the problem with diarrhea that he had a normal dosage.Marland Kitchen.   Option add Wellbutrin or lamotrigine.  Disc this again.  Discussed the pros and cons of doing this versus reducing the olanzapine.  It wass my opinion that he is depressed and reducing the olanzapine probably  not going to fix that in the short-term and that was in fact the case.  No changes as noted.  Discussed other ways of improving depression such as physical exercise and increasing mental stimulation and light.  Option counseling.  He's not motivated.    He agrees to the plan Hold trazodone  Support for his pursuit of disability.    Doing light therapy as suggested.    Follow-up 4-6  weeks  Iona Hansen, MD, DFAPA  No future appointments.  No orders of the defined types were placed in this encounter.     -------------------------------

## 2019-03-01 NOTE — Patient Instructions (Signed)
Increase lithium to 600 mg nightly for depression.  This should also help resolve the constipation.  Continue olanzapine 15 mg each evening

## 2019-03-08 ENCOUNTER — Telehealth: Payer: Self-pay | Admitting: Psychiatry

## 2019-03-08 NOTE — Telephone Encounter (Signed)
Pt sister [name] called to report Pt has woke up 3 times in the last week very confused. When you ask him about it he will say it was a dream. Please advise [name] sister @ [phone]

## 2019-03-08 NOTE — Telephone Encounter (Signed)
He was having some of this on occasion before his last visit.  Has it changed?  How long does the confusion last in the morning?  Is he taking over-the-counter medicines like Benadryl?  I do not see any recent med changes that would explain these episodes.  The recent increase in lithium is still much less than he was taking before and is not typically associated with confusion.  Does she have any additional input as to potential cause or her opinion of cause?

## 2019-03-09 ENCOUNTER — Other Ambulatory Visit: Payer: Self-pay | Admitting: Psychiatry

## 2019-03-09 ENCOUNTER — Telehealth: Payer: Self-pay | Admitting: Psychiatry

## 2019-03-09 ENCOUNTER — Other Ambulatory Visit: Payer: Self-pay

## 2019-03-09 DIAGNOSIS — F319 Bipolar disorder, unspecified: Secondary | ICD-10-CM

## 2019-03-09 MED ORDER — LORAZEPAM 1 MG PO TABS: ORAL_TABLET | ORAL | 0 refills | Status: DC

## 2019-03-09 MED ORDER — OLANZAPINE 20 MG PO TABS: 20.0000 mg | ORAL_TABLET | Freq: Every day | ORAL | 0 refills | Status: DC

## 2019-03-09 NOTE — Telephone Encounter (Signed)
Called to cancel Rx for Lorazepam 1 mg submitted to [facility] by Dr. Jennelle Human, per family change to [facility] in [place].

## 2019-03-09 NOTE — Progress Notes (Signed)
Patient is exhibiting more manic symptoms including not sleeping hyperactive and agitated and more confused.  There is no evidence of traditional lithium toxicity symptoms.  No other recent med changes would seem to justify his current behavioral symptoms.  Olanzapine was reduced fairly recently in hopes of improving energy without any improvement in energy or motivation.  We will administer Ativan 1 to 2 mg every 6 hours as needed agitation and increase olanzapine to 20 or up to 22.5 mg daily.  They want to try to keep him out of the hospital if possible because he is uninsured.

## 2019-03-09 NOTE — Telephone Encounter (Signed)
Thank you for taking care of those prescriptions for olanzapine and lorazepam

## 2019-03-09 NOTE — Telephone Encounter (Signed)
Adelbert's sister called and said that the [facility] doesn't have the ativan in stock so she wants it sent to the [facility]. Also she says it is too hard to cut the zyprexa. She says it crumbles when cut. So if you can send a new script od the 20 mg she would appreciate it

## 2019-03-20 ENCOUNTER — Telehealth: Payer: Self-pay | Admitting: Psychiatry

## 2019-03-20 ENCOUNTER — Other Ambulatory Visit: Payer: Self-pay

## 2019-03-20 MED ORDER — HALOPERIDOL 5 MG PO TABS: 5.0000 mg | ORAL_TABLET | Freq: Every day | ORAL | 0 refills | Status: DC

## 2019-03-20 NOTE — Telephone Encounter (Signed)
William Frye stated that it is very consistent with past episodes. Please send Rx to [facility] in [place]. They usually use [facility] but they close soon and she wants to get this started ASAP. Thanks

## 2019-03-20 NOTE — Telephone Encounter (Signed)
Called [name] and she reports he is in and out of reality, talking to people that aren't there, thinks he has lost strength in his legs so took clothes off and laid naked in the floor but then got up and walked. Started about a week ago. Started with getting confused but has gotten significantly worse over the past week. Confused, does not seem to know where he is, forgetting where his bathroom is. Please advise.

## 2019-03-20 NOTE — Telephone Encounter (Signed)
Pt sister [name] called to report pt not doing so well. Seeing and hearing things. Taking his clothes off and hallucinating. Please advise ASAP. [phone]

## 2019-03-20 NOTE — Telephone Encounter (Signed)
The disorientation that is not knowing where he is or not knowing where the bathroom is he is not usual with most psychotic symptoms.  I cannot rule out the possibility that there is some other medical problem going on such as a UTI or that his lithium level for some reason has gotten too high.  My preference would be that he get checked out again medically to rule out that this is some sort of delirium.  However it may be part of his psychosis as he has been confused with previous episodes of psychosis.  If it seems similar to prior episodes then the next step, since we have reached the highest dose of olanzapine, is to add something like haloperidol 5 mg 1 nightly.  #30 no refills   he should continue the other medicines exactly as he is taking them.

## 2019-03-20 NOTE — Telephone Encounter (Signed)
Noted will submit Rx for Haldol

## 2019-04-03 ENCOUNTER — Telehealth: Payer: Self-pay | Admitting: Psychiatry

## 2019-04-03 NOTE — Telephone Encounter (Signed)
William Frye called. William Frye had another episode last night. She is not certain if she should give him Haldol because its makes him fall and he was already up all night, falling down, acting strange and hallucinating. Please advise. Not sure if Olanzepine is helping

## 2019-04-12 ENCOUNTER — Other Ambulatory Visit: Payer: Self-pay

## 2019-04-12 ENCOUNTER — Encounter: Payer: Self-pay | Admitting: Psychiatry

## 2019-04-12 ENCOUNTER — Ambulatory Visit (INDEPENDENT_AMBULATORY_CARE_PROVIDER_SITE_OTHER): Payer: Self-pay | Admitting: Psychiatry

## 2019-04-12 DIAGNOSIS — G3184 Mild cognitive impairment, so stated: Secondary | ICD-10-CM

## 2019-04-12 DIAGNOSIS — F411 Generalized anxiety disorder: Secondary | ICD-10-CM

## 2019-04-12 DIAGNOSIS — F319 Bipolar disorder, unspecified: Secondary | ICD-10-CM

## 2019-04-12 DIAGNOSIS — G4721 Circadian rhythm sleep disorder, delayed sleep phase type: Secondary | ICD-10-CM

## 2019-04-12 NOTE — Patient Instructions (Addendum)
Start Haloperidol 5 tablets 1 daily for 5 days then 2 daily for 5 days, then 3 daily  Reduce olanzapine to 1/2 daily for 5 days, then 1/4 daily for 5 days, then stop it.  Try to get a neurology appt to evaluate possible seizures associated with episodic  falling, loss of memory and hallucinations including visual and auditory hallucinations.  Episodes followed by excessive sleepiness and no memory for the episodes.
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
## 2019-04-13 ENCOUNTER — Other Ambulatory Visit: Payer: Self-pay | Admitting: Psychiatry

## 2019-04-13 NOTE — Telephone Encounter (Signed)
Looks like dose change yesterday

## 2019-04-14 ENCOUNTER — Telehealth: Payer: Self-pay | Admitting: Psychiatry

## 2019-04-14 NOTE — Telephone Encounter (Signed)
Sister, William Frye, called to ask for a referral to a neurologist.  Dr Jennelle Human recommended a EEG and she would like to get this done by this neurologist to is close to them there in [place].  She can't be seen without a referral.  It is for Dr. Dorene Ar?  Phone [phone].  To reach [name] call [phone].

## 2019-04-19 ENCOUNTER — Other Ambulatory Visit: Payer: Self-pay

## 2019-04-19 ENCOUNTER — Telehealth: Payer: Self-pay | Admitting: Psychiatry

## 2019-04-19 MED ORDER — HALOPERIDOL 5 MG PO TABS: 15.0000 mg | ORAL_TABLET | Freq: Every evening | ORAL | 1 refills | Status: DC

## 2019-04-19 NOTE — Telephone Encounter (Signed)
I have talked with [facility] in [facility] [place] [facility] about the referral to them.  They are scheduled out to Mid to end of May for [place].  I advised Lillia Abed of this so she said to hold off on this referral.  She will try to find another neurologist that can see Onalee Hua sooner.  She said she sent [place] a letter of the info for [facility] and asking for the referral, but now she says to disregard that letter when it comes.

## 2019-04-19 NOTE — Telephone Encounter (Signed)
Sister, Lillia Abed, called to request refill of Tayvon's haloperidol. Ivann is now staying with her in [place] so the refill needs to be submitted to the [place] at [address], [place] [address]. [place], [place], [phone]

## 2019-04-19 NOTE — Telephone Encounter (Signed)
Refill for Haldol submitted to [facility] in [facility] [place]

## 2019-04-25 NOTE — Telephone Encounter (Signed)
Sister, Lillia Abed, called back and has decided to stay with referral to [facility].  Alex Gardener, DO.  I have copied that last 2 OV notes and his demographics.  You ordered an EEG so I guess I need to send that with the referral.  Is there other information you would like to include with this referral?

## 2019-04-25 NOTE — Telephone Encounter (Signed)
Yes please include the following with the EEG order: possible seizures associated with episodic  falling, loss of memory and hallucinations including visual and auditory hallucinations.  Episodes followed by excessive sleepiness and no memory for the episodes.

## 2019-05-11 ENCOUNTER — Ambulatory Visit (INDEPENDENT_AMBULATORY_CARE_PROVIDER_SITE_OTHER): Payer: Medicaid Other | Admitting: Psychiatry

## 2019-05-11 ENCOUNTER — Encounter: Payer: Self-pay | Admitting: Psychiatry

## 2019-05-11 DIAGNOSIS — G4721 Circadian rhythm sleep disorder, delayed sleep phase type: Secondary | ICD-10-CM

## 2019-05-11 DIAGNOSIS — G3184 Mild cognitive impairment, so stated: Secondary | ICD-10-CM

## 2019-05-11 DIAGNOSIS — F315 Bipolar disorder, current episode depressed, severe, with psychotic features: Secondary | ICD-10-CM

## 2019-05-11 DIAGNOSIS — F411 Generalized anxiety disorder: Secondary | ICD-10-CM
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
## 2019-06-12 ENCOUNTER — Ambulatory Visit (INDEPENDENT_AMBULATORY_CARE_PROVIDER_SITE_OTHER): Payer: Medicaid Other | Admitting: Psychiatry

## 2019-06-12 ENCOUNTER — Other Ambulatory Visit: Payer: Self-pay

## 2019-06-12 ENCOUNTER — Encounter: Payer: Self-pay | Admitting: Psychiatry

## 2019-06-12 DIAGNOSIS — G4721 Circadian rhythm sleep disorder, delayed sleep phase type: Secondary | ICD-10-CM

## 2019-06-12 DIAGNOSIS — F315 Bipolar disorder, current episode depressed, severe, with psychotic features: Secondary | ICD-10-CM

## 2019-06-12 DIAGNOSIS — F411 Generalized anxiety disorder: Secondary | ICD-10-CM

## 2019-06-12 DIAGNOSIS — G3184 Mild cognitive impairment, so stated: Secondary | ICD-10-CM

## 2019-06-12 MED ORDER — PALIPERIDONE ER 6 MG PO TB24: 6.0000 mg | ORAL_TABLET | Freq: Every day | ORAL | 1 refills | Status: DC

## 2019-06-12 NOTE — Patient Instructions (Signed)
Start paliperidone 6 mg daily and reduce haloperidol to 2 tablets daily for 2 weeks.  Then as long as things are going well reduce haloperidol to 1 tablet daily
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
## 2019-06-13 ENCOUNTER — Ambulatory Visit: Payer: Medicaid Other | Admitting: Family Medicine

## 2019-06-13 ENCOUNTER — Telehealth: Payer: Self-pay

## 2019-06-13 NOTE — Telephone Encounter (Signed)
Contacted [employer] [phone] for a prior authorization on PALIPERIDONE 6 MG #30/30 approved effective 06/13/2019-06/07/2020, PA# [id number].   OJ#[id number] Q through [facility]

## 2019-06-19 ENCOUNTER — Telehealth: Payer: Self-pay | Admitting: Psychiatry

## 2019-06-19 NOTE — Telephone Encounter (Signed)
Patient's sister called and said that he took the new medicine for three days and then he started hallucinating and was off balance. So the sister started him back on the original meds he was taking so he is back on the haldol.

## 2019-06-26 ENCOUNTER — Other Ambulatory Visit: Payer: Self-pay | Admitting: Psychiatry

## 2019-07-03 ENCOUNTER — Other Ambulatory Visit: Payer: Self-pay

## 2019-07-03 ENCOUNTER — Telehealth: Payer: Self-pay | Admitting: Psychiatry

## 2019-07-03 DIAGNOSIS — F319 Bipolar disorder, unspecified: Secondary | ICD-10-CM

## 2019-07-03 MED ORDER — LITHIUM CARBONATE 600 MG PO CAPS: 600.0000 mg | ORAL_CAPSULE | Freq: Every day | ORAL | 0 refills | Status: DC

## 2019-07-03 NOTE — Telephone Encounter (Signed)
Rx submitted

## 2019-07-03 NOTE — Telephone Encounter (Signed)
Pt will be out of his lithium soon, he has some left. His rx is for 300mg  so he has been taking 2. Will run out soon. Pt would like rx to be sent to [facility] on [address].

## 2019-07-10 ENCOUNTER — Other Ambulatory Visit: Payer: Self-pay

## 2019-07-10 ENCOUNTER — Telehealth: Payer: Self-pay | Admitting: Psychiatry

## 2019-07-10 MED ORDER — HALOPERIDOL 5 MG PO TABS: ORAL_TABLET | ORAL | 0 refills | Status: DC

## 2019-07-10 NOTE — Telephone Encounter (Signed)
Pt requesting refill for Haldol @ [facility] [address] on file. Appt 5/24

## 2019-07-10 NOTE — Telephone Encounter (Signed)
RX sent

## 2019-07-24 ENCOUNTER — Other Ambulatory Visit: Payer: Self-pay

## 2019-07-24 ENCOUNTER — Ambulatory Visit (INDEPENDENT_AMBULATORY_CARE_PROVIDER_SITE_OTHER): Payer: Self-pay | Admitting: Psychiatry

## 2019-07-24 ENCOUNTER — Encounter: Payer: Self-pay | Admitting: Psychiatry

## 2019-07-24 DIAGNOSIS — T433X5D Adverse effect of phenothiazine antipsychotics and neuroleptics, subsequent encounter: Secondary | ICD-10-CM

## 2019-07-24 DIAGNOSIS — G3184 Mild cognitive impairment, so stated: Secondary | ICD-10-CM

## 2019-07-24 DIAGNOSIS — F315 Bipolar disorder, current episode depressed, severe, with psychotic features: Secondary | ICD-10-CM

## 2019-07-24 DIAGNOSIS — F411 Generalized anxiety disorder: Secondary | ICD-10-CM
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
## 2019-07-24 NOTE — Patient Instructions (Signed)
Start paliperidone 6 mg each evening and continue haloperidol 15 mg nightly for 2 weeks. Then reduce haloperidol to 2-1/2 tablets for 5 days, then reduce haloperidol to 2 tablets daily.  And continue paliperidone 6 mg daily.  End of June, call and we'll plan to increase paliperidone and make plans to reduce haloperidol again.

## 2019-07-28 ENCOUNTER — Telehealth: Payer: Self-pay | Admitting: Psychiatry

## 2019-07-28 NOTE — Telephone Encounter (Signed)
This message will need to be reviewed by Dr. Jennelle Human. No changes at this time.

## 2019-07-28 NOTE — Telephone Encounter (Signed)
Pt's sister [name] called and states William Frye has had bad side effects after just 2 days of his medication INVEGA. Sister reports Wray has been hallucinating, drooling and also has wet the bed at night. She says since then she has stopped giving it to him. She would like to know is there any suggestions that Dr. Jennelle Human has.

## 2019-08-01 NOTE — Telephone Encounter (Signed)
To be in clear continue the haloperidol without change.

## 2019-08-01 NOTE — Telephone Encounter (Signed)
Obviously stop paliperidone if not already done.  Haldol has been the most effective, best tolerated med so far.  He's very sensitive to med changes and complicated.   I discussed starting modafinil with his sister at the last visit to try to counteract some of the excessive sleepiness from the haloperidol.  That is the only thing I can do over the phone without another appointment.  Modafinil should not add any side effects as it is typically well tolerated and should help with alertness.  I am running out of generic medication options for treating his psychotic symptoms.  They may want to consider getting a second opinion at a teaching hospital.  If they are interested in doing that [facility] would be the obvious best option.  They could call the psychiatry department and [facility] and tell them they want a second opinion at their public clinic for patients with a history of psychosis.

## 2019-08-02 NOTE — Telephone Encounter (Signed)
Spoke with [name] and they want to hold off on Modafinil right now but appreciate the offer. I also discussed [facility] but she really didn't want to follow up with them.  He's stable at the moment and they will go from there.

## 2019-08-10 ENCOUNTER — Other Ambulatory Visit: Payer: Self-pay | Admitting: Psychiatry

## 2019-08-22 ENCOUNTER — Other Ambulatory Visit: Payer: Self-pay

## 2019-08-22 ENCOUNTER — Ambulatory Visit: Payer: Medicaid Other | Admitting: Family Medicine

## 2019-08-23 ENCOUNTER — Ambulatory Visit (INDEPENDENT_AMBULATORY_CARE_PROVIDER_SITE_OTHER): Payer: Medicaid Other | Admitting: Family Medicine

## 2019-08-23 ENCOUNTER — Encounter: Payer: Self-pay | Admitting: Family Medicine

## 2019-08-23 VITALS — BP 130/74 | HR 55 | Temp 97.7°F | Wt 236.7 lb

## 2019-08-23 DIAGNOSIS — R531 Weakness: Secondary | ICD-10-CM

## 2019-08-23 DIAGNOSIS — R3981 Functional urinary incontinence: Secondary | ICD-10-CM | POA: Diagnosis not present

## 2019-08-23 DIAGNOSIS — R972 Elevated prostate specific antigen [PSA]: Secondary | ICD-10-CM

## 2019-08-23 DIAGNOSIS — R5383 Other fatigue: Secondary | ICD-10-CM

## 2019-08-23 LAB — COMPREHENSIVE METABOLIC PANEL
ALT: 15 U/L (ref 0–53)
AST: 17 U/L (ref 0–37)
Albumin: 4.2 g/dL (ref 3.5–5.2)
Alkaline Phosphatase: 146 U/L — ABNORMAL HIGH (ref 39–117)
BUN: 10 mg/dL (ref 6–23)
CO2: 26 mEq/L (ref 19–32)
Calcium: 9.6 mg/dL (ref 8.4–10.5)
Chloride: 105 mEq/L (ref 96–112)
Creatinine, Ser: 0.88 mg/dL (ref 0.40–1.50)
GFR: 87.69 mL/min (ref >60.00)
Glucose, Bld: 108 mg/dL — ABNORMAL HIGH (ref 70–99)
Potassium: 4.1 mEq/L (ref 3.5–5.1)
Sodium: 137 mEq/L (ref 135–145)
Total Bilirubin: 1 mg/dL (ref 0.2–1.2)
Total Protein: 6.3 g/dL (ref 6.0–8.3)

## 2019-08-23 LAB — CBC WITH DIFFERENTIAL/PLATELET
Basophils Absolute: 0.1 10*3/uL (ref 0.0–0.1)
Basophils Relative: 0.8 % (ref 0.0–3.0)
Eosinophils Absolute: 0.1 10*3/uL (ref 0.0–0.7)
Eosinophils Relative: 1.8 % (ref 0.0–5.0)
HCT: 43.4 % (ref 39.0–52.0)
Hemoglobin: 15.1 g/dL (ref 13.0–17.0)
Lymphocytes Relative: 19.2 % (ref 12.0–46.0)
Lymphs Abs: 1.4 10*3/uL (ref 0.7–4.0)
MCHC: 34.8 g/dL (ref 30.0–36.0)
MCV: 90.8 fl (ref 78.0–100.0)
Monocytes Absolute: 0.5 10*3/uL (ref 0.1–1.0)
Monocytes Relative: 7.1 % (ref 3.0–12.0)
Neutro Abs: 5.3 10*3/uL (ref 1.4–7.7)
Neutrophils Relative %: 71.1 % (ref 43.0–77.0)
Platelets: 283 10*3/uL (ref 150.0–400.0)
RBC: 4.78 Mil/uL (ref 4.22–5.81)
RDW: 16 % — ABNORMAL HIGH (ref 11.5–15.5)
WBC: 7.4 10*3/uL (ref 4.0–10.5)

## 2019-08-23 LAB — POCT URINALYSIS DIPSTICK
Bilirubin, UA: NEGATIVE
Blood, UA: NEGATIVE
Glucose, UA: NEGATIVE
Ketones, UA: NEGATIVE
Leukocytes, UA: NEGATIVE
Nitrite, UA: NEGATIVE
Protein, UA: POSITIVE — AB
Spec Grav, UA: 1.025 (ref 1.010–1.025)
Urobilinogen, UA: 2 E.U./dL — AB
pH, UA: 6 (ref 5.0–8.0)

## 2019-08-23 LAB — TSH: TSH: 2.13 u[IU]/mL (ref 0.35–4.50)

## 2019-08-23 LAB — PSA: PSA: 6.6 ng/mL — ABNORMAL HIGH (ref 0.10–4.00)

## 2019-08-23 NOTE — Progress Notes (Signed)
Established Patient Office Visit  Subjective:  Patient ID: William Frye, male    DOB: 1955-06-04  Age: 64 y.o. MRN: [id number]  CC:  Chief Complaint  Patient presents with  . Dizziness    pt states for a couple of months with falls     HPI William Frye presents for increased lethargy, generalized weakness, and some intermittent dizziness over the past several months.  Has only been seen here at this practice once previously when he established care December 2019.  He had longstanding history of depression and high suspicion for bipolar disorder.  He was referred to psychiatry and has been in and out of hospitals frequently over the past year.  He had intermittent issues with psychosis and in March was placed on Haldol high dosage 15 mg at night and also remains on low-dose lithium.  His hallucinations have been controlled but his sister with whom he lives is concerned because of his increased lethargy.  She states he is in bed about 23 out of 24 hours of the day.  He is complained of some lightheadedness with movement but no syncope.  No headaches.  Their main concern today is some urine incontinence.  She states she had several episodes of urine incontinence over the past month.  He does not complain of any slow stream.  Some question whether this may be functional incontinence because of his extreme sedation and lethargy.  He had PSA 5.89 back in December 2019 at that point they declined further work-up because of his psychiatric issues.  There is positive family history of prostate cancer in father.  Denies any recent fever, chills, cough, nausea, vomiting, diarrhea, abdominal pain, chest pains  Past Medical History:  Diagnosis Date  . Depression   . Emphysema with chronic bronchitis (HCC)   . Frequent headaches   . Hallucinations     No past surgical history on file.  Family History  Problem Relation Age of Onset  . Hyperlipidemia Mother   . COPD Father   . Cancer Father     . Depression Brother   . Drug abuse Brother   . Early death Brother   . Mental illness Brother   . Depression Sister     Social History   Socioeconomic History  . Marital status: Divorced    Spouse name: Not on file  . Number of children: Not on file  . Years of education: Not on file  . Highest education level: Not on file  Occupational History  . Not on file  Tobacco Use  . Smoking status: Current Every Day Smoker    Types: Cigarettes  . Smokeless tobacco: Never Used  Vaping Use  . Vaping Use: Never used  Substance and Sexual Activity  . Alcohol use: Not Currently  . Drug use: Never  . Sexual activity: Not Currently    Partners: Female  Other Topics Concern  . Not on file  Social History Narrative  . Not on file   Social Determinants of Health   Financial Resource Strain:   . Difficulty of Paying Living Expenses:   Food Insecurity:   . Worried About Programme researcher, broadcasting/film/video in the Last Year:   . Barista in the Last Year:   Transportation Needs:   . Freight forwarder (Medical):   Marland Kitchen Lack of Transportation (Non-Medical):   Physical Activity:   . Days of Exercise per Week:   . Minutes of Exercise per Session:  Stress:   . Feeling of Stress :   Social Connections:   . Frequency of Communication with Friends and Family:   . Frequency of Social Gatherings with Friends and Family:   . Attends Religious Services:   . Active Member of Clubs or Organizations:   . Attends Banker Meetings:   Marland Kitchen Marital Status:   Intimate Partner Violence:   . Fear of Current or Ex-Partner:   . Emotionally Abused:   Marland Kitchen Physically Abused:   . Sexually Abused:     Outpatient Medications Prior to Visit  Medication Sig Dispense Refill  . aspirin 81 MG EC tablet Take 81 mg by mouth daily. Swallow whole.    . haloperidol (HALDOL) 5 MG tablet TAKE 3 TABLETS BY MOUTH ONCE DAILY AT BEDTIME 90 tablet 0  . lithium 600 MG capsule Take 1 capsule (600 mg total) by mouth  daily. 90 capsule 0  . Multiple Vitamins-Minerals (MENS MULTIVITAMIN PLUS PO) Take by mouth.    . Omega-3 Fatty Acids (FISH OIL) 1000 MG CAPS Take by mouth.    . paliperidone (INVEGA) 6 MG 24 hr tablet Take 1 tablet (6 mg total) by mouth daily. 30 tablet 1  . Saccharomyces boulardii (PROBIOTIC) 250 MG CAPS Take by mouth.    Marland Kitchen LORazepam (ATIVAN) 1 MG tablet 1-2 tablets every 6 hours as needed for agitation 40 tablet 0   No facility-administered medications prior to visit.    Allergies  Allergen Reactions  . Lithium Diarrhea and Nausea Only  . Seroquel [Quetiapine Fumarate]     Psychosis  . Aripiprazole Other (See Comments)    Auditory and visual hallucinations    ROS Review of Systems  Constitutional: Positive for fatigue. Negative for chills and fever.  Respiratory: Negative for cough and shortness of breath.   Cardiovascular: Negative for chest pain and leg swelling.  Gastrointestinal: Negative for abdominal pain, diarrhea, nausea and vomiting.  Genitourinary: Negative for difficulty urinating and hematuria.       See HPI  Neurological: Positive for dizziness, weakness and light-headedness. Negative for seizures, syncope, speech difficulty and headaches.  Hematological: Negative for adenopathy.    Patient very lethargic.  We had difficult time getting much history from him but review of systems largely obtained from his sister, William Frye.   Objective:    Physical Exam Vitals reviewed.  Constitutional:      Comments: Patient is lethargic.  He is arousable and cooperative  Cardiovascular:     Rate and Rhythm: Normal rate and regular rhythm.  Pulmonary:     Effort: Pulmonary effort is normal.     Breath sounds: Normal breath sounds.  Musculoskeletal:     Cervical back: Neck supple.     Right lower leg: No edema.     Left lower leg: No edema.  Neurological:     General: No focal deficit present.     BP 130/74 (BP Location: Left Arm, Patient Position: Sitting, Cuff Size:  Normal)   Pulse (!) 55   Temp 97.7 F (36.5 C) (Temporal)   Wt 236 lb 11.2 oz (107.4 kg)   SpO2 98%   BMI 29.59 kg/m  Wt Readings from Last 3 Encounters:  08/23/19 236 lb 11.2 oz (107.4 kg)  02/15/18 263 lb 1.6 oz (119.3 kg)     Health Maintenance Due  Topic Date Due  . Hepatitis C Screening  Never done  . COVID-19 Vaccine (1) Never done  . HIV Screening  Never done  . TETANUS/TDAP  Never done  . COLONOSCOPY  Never done    There are no preventive care reminders to display for this patient.  Lab Results  Component Value Date   TSH 0.91 02/15/2018   Lab Results  Component Value Date   WBC 6.9 01/04/2019   HGB 16.0 01/04/2019   HCT 49.8 01/04/2019   MCV 93.4 01/04/2019   PLT 236 01/04/2019   Lab Results  Component Value Date   NA 139 01/04/2019   K 4.1 01/04/2019   CO2 22 01/04/2019   GLUCOSE 106 (H) 01/04/2019   BUN 20 01/04/2019   CREATININE 1.04 01/04/2019   BILITOT 0.9 01/04/2019   ALKPHOS 124 01/04/2019   AST 19 01/04/2019   ALT 22 01/04/2019   PROT 7.6 01/04/2019   ALBUMIN 4.0 01/04/2019   CALCIUM 9.2 01/04/2019   ANIONGAP 10 01/04/2019   GFR 76.39 02/15/2018   No results found for: CHOL No results found for: HDL No results found for: LDLCALC No results found for: TRIG No results found for: CHOLHDL No results found for: HGBA1C    Assessment & Plan:   #1 history of bipolar 1 disorder followed by psychiatry and currently treated with Haldol and lithium.  #2 extreme lethargy and generalized weakness.  His sister questions how much of this is due to his medication although she states he was very lethargic even prior to starting the Haldol.  -Recommend checking some labs with TSH, CBC, comprehensive metabolic panel -We also discussed setting up some home physical therapy because of his progressive muscular weakness  #3 history of elevated PSA.  Positive family history of prostate cancer -Recheck PSA.  If remains elevated consider urology  referral  #4 reports of recent urine incontinence.  He does not report any symptoms of likely overflow incontinence.  Question is whether this is a functional incontinence because of his extreme lethargy  No orders of the defined types were placed in this encounter.   Follow-up: No follow-ups on file.    Carolann Littler, MD

## 2019-08-23 NOTE — Patient Instructions (Signed)

## 2019-08-25 ENCOUNTER — Other Ambulatory Visit: Payer: Self-pay

## 2019-08-25 DIAGNOSIS — R972 Elevated prostate specific antigen [PSA]: Secondary | ICD-10-CM

## 2019-08-29 ENCOUNTER — Ambulatory Visit: Payer: Medicaid Other | Admitting: Family Medicine

## 2019-08-31 ENCOUNTER — Ambulatory Visit (INDEPENDENT_AMBULATORY_CARE_PROVIDER_SITE_OTHER): Payer: Self-pay | Admitting: Psychiatry

## 2019-08-31 ENCOUNTER — Other Ambulatory Visit: Payer: Self-pay

## 2019-08-31 ENCOUNTER — Encounter: Payer: Self-pay | Admitting: Psychiatry

## 2019-08-31 DIAGNOSIS — F315 Bipolar disorder, current episode depressed, severe, with psychotic features: Secondary | ICD-10-CM

## 2019-08-31 DIAGNOSIS — G3184 Mild cognitive impairment, so stated: Secondary | ICD-10-CM

## 2019-08-31 DIAGNOSIS — T433X5D Adverse effect of phenothiazine antipsychotics and neuroleptics, subsequent encounter: Secondary | ICD-10-CM
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
## 2019-09-04 ENCOUNTER — Other Ambulatory Visit: Payer: Self-pay | Admitting: Psychiatry

## 2019-09-06 ENCOUNTER — Other Ambulatory Visit: Payer: Self-pay

## 2019-09-06 ENCOUNTER — Encounter: Payer: Self-pay | Admitting: Family Medicine

## 2019-09-06 ENCOUNTER — Ambulatory Visit (INDEPENDENT_AMBULATORY_CARE_PROVIDER_SITE_OTHER): Payer: Medicaid Other | Admitting: Family Medicine

## 2019-09-06 VITALS — BP 118/68 | HR 95 | Temp 98.0°F | Wt 232.8 lb

## 2019-09-06 DIAGNOSIS — R453 Demoralization and apathy: Secondary | ICD-10-CM

## 2019-09-06 DIAGNOSIS — Z0000 Encounter for general adult medical examination without abnormal findings: Secondary | ICD-10-CM

## 2019-09-06 DIAGNOSIS — R2689 Other abnormalities of gait and mobility: Secondary | ICD-10-CM

## 2019-09-06 DIAGNOSIS — R972 Elevated prostate specific antigen [PSA]: Secondary | ICD-10-CM

## 2019-09-06 NOTE — Progress Notes (Signed)
Established Patient Office Visit  Subjective:  Patient ID: William Frye, male    DOB: 1955/10/04  Age: 64 y.o. MRN: [id number]  CC:  Chief Complaint  Patient presents with  . Annual Exam    pt is  here for cpe    HPI [name] presents for physical exam- and for separate issue below..  He has been diagnosed previously with bipolar 1 disorder and is followed by psychiatry.  He has history of recurrent depression issues.  Is currently on combination therapy with high-dose Haldol and lithium.  We recently obtained some lab work and thyroid functions came back normal.  Chemistries and CBC were unremarkable with the exception of mildly elevated alkaline phosphatase of uncertain significance.  His PSA did come back elevated 6.6 which compares with 5.8 back in 2019.  We have made urology referral which is pending at this time.  His sister with whom he lives has concerns regarding diagnosis.  He is treated for bipolar but has had several features which suggest possible Lewy body dementia.  He has had progression of symptoms especially over the past year- particularly with increased sedation, less engagement,etc..  He has decreased executive brain functions and has increased apathy.  He has visual and auditory hallucinations which have been kept at bay by Haldol but has had heavy sedation from Haldol.  Has had balance issues and frequent falls.  He had some orthostatic hypotension issues.  He has extreme fatigue and drowsiness and symptoms bordering on cataplexy at times.  His symptoms seem to be somewhat acute at times.  He has fluctuations in his cognitive status.  They have also noted that he seems to occasionally be acting out his dreams.  Their initial concerns were whether this was largely related to high dose Haldol but now raises concern for other concomitant process such as dementia. No recent CNS imaging except for CT head at [facility] 10/20 which was unremarkable.    He has no interest  in social interactions such as conversation or watching TV.  He has had some issues with loss of bladder control and we had questioned whether this may be more of a functional incontinence due to his severe sedation from Haldol.  He does describe occasional slow stream but not clear this is any kind of overflow incontinence.  Regarding health maintenance last tetanus unknown.  No history of shingles vaccine.  No history of hepatitis C screening.  No history of colon cancer screening  Past Medical History:  Diagnosis Date  . Depression   . Emphysema with chronic bronchitis (HCC)   . Frequent headaches   . Hallucinations     No past surgical history on file.  Family History  Problem Relation Age of Onset  . Hyperlipidemia Mother   . COPD Father   . Cancer Father   . Depression Brother   . Drug abuse Brother   . Early death Brother   . Mental illness Brother   . Depression Sister     Social History   Socioeconomic History  . Marital status: Divorced    Spouse name: Not on file  . Number of children: Not on file  . Years of education: Not on file  . Highest education level: Not on file  Occupational History  . Not on file  Tobacco Use  . Smoking status: Current Every Day Smoker    Types: Cigarettes  . Smokeless tobacco: Never Used  Vaping Use  . Vaping Use: Never  used  Substance and Sexual Activity  . Alcohol use: Not Currently  . Drug use: Never  . Sexual activity: Not Currently    Partners: Female  Other Topics Concern  . Not on file  Social History Narrative  . Not on file   Social Determinants of Health   Financial Resource Strain:   . Difficulty of Paying Living Expenses:   Food Insecurity:   . Worried About Programme researcher, broadcasting/film/video in the Last Year:   . Barista in the Last Year:   Transportation Needs:   . Freight forwarder (Medical):   Marland Kitchen Lack of Transportation (Non-Medical):   Physical Activity:   . Days of Exercise per Week:   . Minutes of  Exercise per Session:   Stress:   . Feeling of Stress :   Social Connections:   . Frequency of Communication with Friends and Family:   . Frequency of Social Gatherings with Friends and Family:   . Attends Religious Services:   . Active Member of Clubs or Organizations:   . Attends Banker Meetings:   Marland Kitchen Marital Status:   Intimate Partner Violence:   . Fear of Current or Ex-Partner:   . Emotionally Abused:   Marland Kitchen Physically Abused:   . Sexually Abused:     Outpatient Medications Prior to Visit  Medication Sig Dispense Refill  . aspirin 81 MG EC tablet Take 81 mg by mouth daily. Swallow whole.    . lithium 600 MG capsule Take 1 capsule (600 mg total) by mouth daily. 90 capsule 0  . Multiple Vitamins-Minerals (MENS MULTIVITAMIN PLUS PO) Take by mouth.    . Omega-3 Fatty Acids (FISH OIL) 1000 MG CAPS Take by mouth.    . haloperidol (HALDOL) 5 MG tablet TAKE 3 TABLETS BY MOUTH ONCE DAILY AT BEDTIME (Patient not taking: Reported on 09/06/2019) 90 tablet 2  . Saccharomyces boulardii (PROBIOTIC) 250 MG CAPS Take by mouth. (Patient not taking: Reported on 09/06/2019)     No facility-administered medications prior to visit.    Allergies  Allergen Reactions  . Lithium Diarrhea and Nausea Only  . Seroquel [Quetiapine Fumarate]     Psychosis  . Aripiprazole Other (See Comments)    Auditory and visual hallucinations    ROS Review of Systems  Constitutional: Positive for fatigue. Negative for chills and fever.  Respiratory: Negative for cough and shortness of breath.   Cardiovascular: Negative for chest pain.  Gastrointestinal: Negative for abdominal pain, nausea and vomiting.  Genitourinary: Negative for flank pain and hematuria.  Neurological: Positive for weakness. Negative for seizures, facial asymmetry, speech difficulty and headaches.  Hematological: Negative for adenopathy. Does not bruise/bleed easily.  Psychiatric/Behavioral: Negative for suicidal ideas.         Objective:    Physical Exam Vitals reviewed.  Constitutional:      Comments: Pt responds to questions appropriately but very apathetic and makes very little eye contact.    Cardiovascular:     Rate and Rhythm: Normal rate and regular rhythm.  Pulmonary:     Effort: Pulmonary effort is normal.     Breath sounds: Normal breath sounds.  Abdominal:     Palpations: Abdomen is soft.     Tenderness: There is no abdominal tenderness.  Musculoskeletal:     Cervical back: Neck supple.     Right lower leg: No edema.     Left lower leg: No edema.  Neurological:     General: No focal deficit  present.     Cranial Nerves: No cranial nerve deficit.     BP 118/68 (BP Location: Left Arm, Patient Position: Sitting, Cuff Size: Normal)   Pulse 95   Temp 98 F (36.7 C) (Temporal)   Wt 232 lb 12.8 oz (105.6 kg)   SpO2 98%   BMI 29.10 kg/m  Wt Readings from Last 3 Encounters:  09/06/19 232 lb 12.8 oz (105.6 kg)  08/23/19 236 lb 11.2 oz (107.4 kg)  02/15/18 263 lb 1.6 oz (119.3 kg)     Health Maintenance Due  Topic Date Due  . Hepatitis C Screening  Never done  . HIV Screening  Never done  . TETANUS/TDAP  Never done  . COLONOSCOPY  Never done    There are no preventive care reminders to display for this patient.  Lab Results  Component Value Date   TSH 2.13 08/23/2019   Lab Results  Component Value Date   WBC 7.4 08/23/2019   HGB 15.1 08/23/2019   HCT 43.4 08/23/2019   MCV 90.8 08/23/2019   PLT 283.0 08/23/2019   Lab Results  Component Value Date   NA 137 08/23/2019   K 4.1 08/23/2019   CO2 26 08/23/2019   GLUCOSE 108 (H) 08/23/2019   BUN 10 08/23/2019   CREATININE 0.88 08/23/2019   BILITOT 1.0 08/23/2019   ALKPHOS 146 (H) 08/23/2019   AST 17 08/23/2019   ALT 15 08/23/2019   PROT 6.3 08/23/2019   ALBUMIN 4.2 08/23/2019   CALCIUM 9.6 08/23/2019   ANIONGAP 10 01/04/2019   GFR 87.69 08/23/2019   No results found for: CHOL No results found for: HDL No results  found for: LDLCALC No results found for: TRIG No results found for: CHOLHDL No results found for: XTKW4O    Assessment & Plan:   #1 physical exam/health maintenance.  We discussed several health maintenance issues and he declines most of these including -Tdap -Shingles vaccine -Covid vaccination -Colon cancer screening either in terms of colonoscopy or Cologuard -Hepatitis C screening  #2 elevated PSA.  He has pending follow-up with urology  #3 intermittent hallucinations.  Given multiple clinical features above concern is whether he could have Lewy body dementia. -We recommended neurology referral for further evaluation.  His symptoms are challenging to evaluate in the setting of high-dose Haldol therapy  No orders of the defined types were placed in this encounter.   Follow-up: No follow-ups on file.    Evelena Peat, MD

## 2019-09-18 ENCOUNTER — Telehealth: Payer: Self-pay | Admitting: Psychiatry

## 2019-09-18 NOTE — Telephone Encounter (Signed)
Pt sister [name] left message asking for advise on how to reduce Haldol 5 mg. Return call ASAP [phone]

## 2019-09-19 NOTE — Telephone Encounter (Signed)
Very well based on the information provided reduce haloperidol to 5 mg at the beginning of the upcoming week.

## 2019-09-20 NOTE — Telephone Encounter (Signed)
William Frye is aware okay to decrease Haldol to 5 mg first of the week. Advised her to call back with further questions or concerns.

## 2019-11-02 ENCOUNTER — Ambulatory Visit: Payer: Medicaid Other | Admitting: Neurology

## 2019-11-02 ENCOUNTER — Encounter: Payer: Self-pay | Admitting: Neurology

## 2019-11-02 ENCOUNTER — Other Ambulatory Visit: Payer: Self-pay

## 2019-11-02 VITALS — BP 133/95 | HR 86 | Ht 75.0 in | Wt 220.0 lb

## 2019-11-02 DIAGNOSIS — E538 Deficiency of other specified B group vitamins: Secondary | ICD-10-CM | POA: Diagnosis not present

## 2019-11-02 DIAGNOSIS — G3183 Dementia with Lewy bodies: Secondary | ICD-10-CM | POA: Diagnosis not present

## 2019-11-02 DIAGNOSIS — F0280 Dementia in other diseases classified elsewhere without behavioral disturbance: Secondary | ICD-10-CM

## 2019-11-02 NOTE — Progress Notes (Signed)
Reason for visit: Bipolar disorder, possible dementia  Referring physician: Dr. Jeannetta Ellis William Frye is a 64 y.o. male  History of present illness:  Mr. William Frye is a 64 year old right-handed white male with a history of bipolar type symptoms throughout his life but he did not have a formal diagnosis until around 2019.  In 2008 his bipolar symptoms became more severe.  By 2019, he was no longer able to function independently, he had to come and live with his family.  He cannot hold down a job.  He had severe depression and anxiety, and would have episodes where he was manic and then finally would crash into a depressive episode.  The patient was in a psychiatric ward for a week in July 2020.  The patient was psychotic around the time, with hallucinations.  Currently, he has continued to degenerate.  He has daytime drowsiness, he sleeps up to 23 hours a day, and he is not motivated to take care of himself or interact with other people.  He has an William Frye who comes in to help him bathe and dress as he will not do this on his own.  He will not do any hygiene activities.  The patient has both visual and auditory hallucinations.  He is followed by Dr. Jennelle Human from psychiatry.  He has been on a multitude of antipsychotic medications in the past including Abilify, Seroquel, Risperdal, Zyprexa, and Haldol.  The patient has been on Trileptal as well.  He did not tolerate these medications well, he recently was taken off of Haldol on 13 October 2019.  On the medication, he had parkinsonian features with shuffling gait, decreased arm swing.  This has continued off the medication.  There is a concern for Lewy body dementia.  The patient has had short-term memory issues that have developed, he has difficulty with apraxias, he cannot figure out how to use the microwave or telephone.  He will forget where the refrigerator is or where trash can is located.  He has been falling frequently a year ago, this has  improved recently.  He is on lithium currently.  His handwriting has deteriorated on these medications.  On Haldol, he had urinary incontinence, this is improved off of the medication.  He will have occasional slurred speech and leg tremors and occasional drooling.  The patient will occasionally act out his dreams at night.  He is coming to this office for further evaluation.  The patient reports no focal numbness or weakness of the face, arms, legs.  The denies any headaches or dizziness.  Past Medical History:  Diagnosis Date  . Depression   . Emphysema with chronic bronchitis (HCC)   . Frequent headaches   . Hallucinations     History reviewed. No pertinent surgical history.  Family History  Problem Relation Age of Onset  . Hyperlipidemia Mother   . COPD Father   . Cancer Father   . Depression Brother   . Drug abuse Brother   . Early death Brother   . Mental illness Brother   . Depression Sister   . Seizures Neg Hx   . Parkinson's disease Neg Hx   . Dementia Neg Hx     Social history:  reports that he quit smoking about 20 months ago. His smoking use included cigarettes. He quit after 30.00 years of use. He has never used smokeless tobacco. He reports previous alcohol use. He reports that he does not use drugs.  Medications:  Prior  to Admission medications   Medication Sig Start Date End Date Taking? Authorizing Provider  aspirin 81 MG EC tablet Take 81 mg by mouth daily. Swallow whole.    [provider]  haloperidol (HALDOL) 5 MG tablet TAKE 3 TABLETS BY MOUTH ONCE DAILY AT BEDTIME Patient not taking: Reported on 09/06/2019 09/04/19   Cottle, Steva Ready., MD  lithium 600 MG capsule Take 1 capsule (600 mg total) by mouth daily. 07/03/19   Cottle, Steva Ready., MD  Multiple Vitamins-Minerals (MENS MULTIVITAMIN PLUS PO) Take by mouth.    [provider]  Omega-3 Fatty Acids (FISH OIL) 1000 MG CAPS Take by mouth.    [provider]  Saccharomyces boulardii  (PROBIOTIC) 250 MG CAPS Take by mouth. Patient not taking: Reported on 09/06/2019    [provider]      Allergies  Allergen Reactions  . Lithium Diarrhea and Nausea Only  . Seroquel [Quetiapine Fumarate]     Psychosis  . Aripiprazole Other (See Comments)    Auditory and visual hallucinations    ROS:  Out of a complete 14 system review of symptoms, the patient complains only of the following symptoms, and all other reviewed systems are negative.  Memory problems Walking difficulty Depression  Blood pressure (!) 133/95, pulse 86, height 6\' 3"  (1.905 m), weight 220 lb (99.8 kg).  Physical Exam  General: The patient is alert and cooperative at the time of the examination.  Eyes: Pupils are equal, round, and reactive to light. Discs are flat bilaterally.  Neck: The neck is supple, no carotid bruits are noted.  Respiratory: The respiratory examination is clear.  Cardiovascular: The cardiovascular examination reveals a regular rate and rhythm, no obvious murmurs or rubs are noted.  Skin: Extremities are without significant edema.  Neurologic Exam  Mental status: The patient is alert and oriented x 3 at the time of the examination. The Mini-Mental status examination done today shows a total score of 19/30. The patient is able to name 6 four-legged animals in 60 seconds.  Cranial nerves: Facial symmetry is present. There is good sensation of the face to pinprick and soft touch bilaterally. The strength of the facial muscles and the muscles to head turning and shoulder shrug are normal bilaterally. Speech is well enunciated, no aphasia or dysarthria is noted. Extraocular movements are full. Visual fields are full. The tongue is midline, and the patient has symmetric elevation of the soft palate. No obvious hearing deficits are noted.  Masking of the face is seen.  Motor: The motor testing reveals 5 over 5 strength of all 4 extremities. Good symmetric motor tone is noted  throughout.  Sensory: Sensory testing is intact to pinprick, soft touch, vibration sensation, and position sense on all 4 extremities. No evidence of extinction is noted.  Coordination: Cerebellar testing reveals good finger-nose-finger and heel-to-shin bilaterally.  Gait and station: The patient is able to arise from a seated position with arms crossed.  Once up, he does have a shuffling type gait, he has decreased arm swing bilaterally but the arm swing is symmetric.  The patient has no resting tremor.  Tandem gait is slightly unsteady.  Romberg is negative.  Reflexes: Deep tendon reflexes are symmetric and normal bilaterally. Toes are downgoing bilaterally.   Assessment/Plan:  1.  Bipolar disorder with psychosis  2.  Gait disorder, parkinsonism  3.  Cognitive deficits, possible dementia  I believe that the possibility of a Lewy body dementia is real.  The patient  has had a significant cognitive decline over the last 2 years.  We will check blood work today.  The sister who comes in with him today believes that he had a head scan at [facility] in October 2020.  We will try to get this result.  If MRI of the brain was not done with and without gadolinium, I will get this set up.  We will consider a DaTscan in the future.  The patient will be sent for neuropsychological testing.  He will follow-up here in 4 months.  Addendum: I have received the report from [facility], the patient had a noncontrasted CT of the head, he did not have MRI.  I will get an MRI set up with and without gadolinium enhancement.  Marlan Palau MD 11/02/2019 10:17 AM  [facility] [address] [place], [place] [address]  Phone [phone] Fax [phone]

## 2019-11-06 LAB — THYROGLOBULIN ANTIBODY: Thyroglobulin Antibody: <1 IU/mL (ref 0.0–0.9)

## 2019-11-06 LAB — ANA W/REFLEX: Anti Nuclear Antibody (ANA): NEGATIVE

## 2019-11-06 LAB — THYROID PEROXIDASE ANTIBODY: Thyroperoxidase Ab SerPl-aCnc: <8 IU/mL (ref 0–34)

## 2019-11-06 LAB — HIV ANTIBODY (ROUTINE TESTING W REFLEX): HIV Screen 4th Generation wRfx: NONREACTIVE

## 2019-11-06 LAB — N-METHYL-D-ASPARTATE RECPT.IGG: N-methyl-D-Aspartate Recpt.IgG: <1:10 {titer}

## 2019-11-06 LAB — VITAMIN B12: Vitamin B-12: 953 pg/mL (ref 232–1245)

## 2019-11-06 LAB — SEDIMENTATION RATE: Sed Rate: 37 mm/hr — ABNORMAL HIGH (ref 0–30)

## 2019-11-06 LAB — RPR: RPR Ser Ql: NONREACTIVE

## 2019-11-07 ENCOUNTER — Other Ambulatory Visit: Payer: Self-pay | Admitting: Psychiatry

## 2019-11-07 ENCOUNTER — Telehealth: Payer: Self-pay | Admitting: Neurology

## 2019-11-07 DIAGNOSIS — F319 Bipolar disorder, unspecified: Secondary | ICD-10-CM

## 2019-11-07 NOTE — Telephone Encounter (Signed)
medicaid order sent to GI. They will reach out to the patient to schedule. No auth FFS via [facility].

## 2019-11-08 NOTE — Telephone Encounter (Signed)
Review.

## 2019-11-09 ENCOUNTER — Encounter: Payer: Self-pay | Admitting: Counselor

## 2019-11-16 ENCOUNTER — Ambulatory Visit: Payer: Medicaid Other | Admitting: Psychiatry

## 2019-12-27 ENCOUNTER — Encounter: Payer: Self-pay | Admitting: Counselor

## 2019-12-28 ENCOUNTER — Ambulatory Visit: Payer: Medicaid Other

## 2019-12-28 ENCOUNTER — Ambulatory Visit (INDEPENDENT_AMBULATORY_CARE_PROVIDER_SITE_OTHER): Payer: Medicaid Other | Admitting: Counselor

## 2019-12-28 ENCOUNTER — Encounter: Payer: Self-pay | Admitting: Counselor

## 2019-12-28 ENCOUNTER — Ambulatory Visit
Admission: RE | Admit: 2019-12-28 | Discharge: 2019-12-28 | Disposition: A | Discharge status: Home / Self Care | Payer: Medicaid Other | Source: Ambulatory Visit | Attending: Neurology | Admitting: Neurology

## 2019-12-28 ENCOUNTER — Other Ambulatory Visit: Payer: Self-pay

## 2019-12-28 DIAGNOSIS — F319 Bipolar disorder, unspecified: Secondary | ICD-10-CM

## 2019-12-28 DIAGNOSIS — F09 Unspecified mental disorder due to known physiological condition: Secondary | ICD-10-CM

## 2019-12-28 DIAGNOSIS — G20 Parkinson's disease: Secondary | ICD-10-CM

## 2019-12-28 DIAGNOSIS — F0280 Dementia in other diseases classified elsewhere without behavioral disturbance: Secondary | ICD-10-CM | POA: Diagnosis not present

## 2019-12-28 DIAGNOSIS — G3183 Dementia with Lewy bodies: Secondary | ICD-10-CM

## 2019-12-28 MED ORDER — GADOBENATE DIMEGLUMINE 529 MG/ML IV SOLN
20.0000 mL | Freq: Once | INTRAVENOUS | Status: AC | PRN
  Administered 2019-12-28: 20 mL via INTRAVENOUS

## 2019-12-28 NOTE — Progress Notes (Signed)
NEUROPSYCHOLOGICAL EVALUATION [facility]  Patient Name: William Frye MRN: [id number] Date of Birth: 1956/02/21 Age: 64 y.o. Education: 12 years  Referral Circumstances and Background Information  William Frye is a 64 y.o., right-hand dominant, divorced man with a long history of mental illness thought to be bipolar spectrum disorder since his 31s and more recently concerns about the possibility of Lewy Body dementia. He did not have a formal diagnosis and was not in treatment until 2019, at which time it seems he had significant decompensation. Over the subsequent two years, he had to move in with his family, developed auditory and visual hallucinations in the setting of trying numerous psychiatric medications, significant cognitive impairment, and episodic confusion. He is not taking care of himself or motivated to interact much with others. He has been followed by Dr. Jennelle Frye from psychiatry who has tried him on a number of different psychotropic agents (Abilify, Risperidal, Seroquel, Zyprexa, Haldol, Lithium, and Haldol). 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 He was recently evaluated by Dr. Anne Frye with [facility] who referred him for neuropsychological evaluation and noted Parkinsonian gait on exam albeit with normal tone and was concerned about the possibility of Lewy Body disease. Importantly, there is mention in the chart that he was acting out his dreams, as per note of Dr. Caryl Frye his PCP (09/06/2019).   On interview, the patient had a hard time describing his disease course. He stated that in 2019, he "came apart" and "couldn't function." He endorsed feeling sad and disorganized when I specifically asked. He does feel as though he is having problems with short term memory and doesn't feel like himself. His sister says that he has had long lasting mood episodes most of his life, since his 35s. He had previously  been on a high for several years and in Spring, 2019, they noticed that he was getting more depressed. They saw him at a wedding and he was "really struggling," and then in December he called asking for help and moved in with his sister. He had severe depression, severe anxiety, and since that time he has been in bed most of the day and has no desire to get up and do things. He had Frye had hallucinations until July, 2020, which developed shortly after he was prescribed abilify. He seems to have "ultra sensitivity" to antidopaminergic medications. While living with his sister, he has been having very significant cognitive problems. His sister stated that he cannot recall how to operate the microwave, he can't remember the door code to their garage door, he was previously getting disoriented indoors although they think that may have been more due to the haldol and he is doing much better since getting off it. He doesn't repeat himself and he doesn't forget things that she tells him. His sister said that he has improved markedly over the past two months from a cognitive and movement perspective since getting off the Haldol. With respect to mood, he has been extremely flat, whereas he used to be "the life of the party." His self-described mood is sad and he does get anxious at times talking about the future. His energy is not good. He is sleeping around 8 hours a night, as per him, although it is hard to tell because he is in bed "23 hours a day." He has not acted out his dreams, since getting off the haldol, although he was doing that previously. He  would be reaching out to grab things and there was one instance where his sister found him on the floor and he said he was trying to find food to feed the dog (they don't have a dog). He stated that he is eating adequately, he may have gained a bit of weight.   At present, the patient is quite functionally impaired, whether due to cognitive or motivational factors. He  is not initiating showering or bathing unless prompted. He said that is related to "laziness," but his sister isn't sure if he is capable of doing these things. He has an aid coming in now who helps. He says that she primarily helps him from not falling while in the shower. He doesn't brush his teeth unless prompted. He is able to dress himself. He has no meaningful function around the house, his family make his meals, do his laundry, and he doesn't help with chores. He goes on some walks with his caregiver although he doesn't initiate them himself. He typically walks about a mile. His sister stated that he has "so many friends" who love him and call him but he doesn't call them back. He no longer goes on facebook. He has no involvement in judgment and problem solving, when they talk about the future, he gets anxious about it. He does seem more oriented to time than he used to on the Haldol and typically knows the month and year. They manage his medications and have been handling all his finances. He is not driving.    Past Medical History and Review of Relevant Studies   Patient Active Problem List   Diagnosis Date Noted  . MDD (major depressive disorder) 01/05/2019  . Bipolar 1 disorder (HCC) 01/05/2019   Review of Neuroimaging and Relevant Medical History: The patient has an MRI of the brain scheduled later today at [facility], my thanks to Dr. Anne Frye for his workup.   Current Outpatient Medications  Medication Sig Dispense Refill  . aspirin 81 MG EC tablet Take 81 mg by mouth daily. Swallow whole.    . lithium 600 MG capsule Take 1 capsule by mouth once daily 90 capsule 0  . Multiple Vitamins-Minerals (MENS MULTIVITAMIN PLUS PO) Take by mouth.    . Omega-3 Fatty Acids (FISH OIL) 1000 MG CAPS Take by mouth.    . Saccharomyces boulardii (PROBIOTIC) 250 MG CAPS Take by mouth.      No current facility-administered medications for this visit.    Family History  Problem Relation Age of  Onset  . Hyperlipidemia Mother   . COPD Father   . Cancer Father   . Depression Brother   . Drug abuse Brother   . Early death Brother   . Mental illness Brother   . Depression Sister   . Seizures Neg Hx   . Parkinson's disease Neg Hx   . Dementia Neg Hx    There is a questionable family history of dementia, their paternal grandmother "had some." There is a family history of psychiatric illness. The patient's younger brother had significant mental health issues, he was Frye diagnosed. He ended his life in the late 30s. They said he was asocial, was delusional and would say things happened that did not happen. Apparently he thought he had some sort of ASD. Interestingly, the patient said that he thought his brother self-diagnosed himself with Shy-Drager syndrome.   Psychosocial History  Developmental, Educational and Employment History: The patient reported a reasonably happy childhood and denied any abuse  or neglect. He stated that he did well in school, earning mainly B's and was Frye held back and didn't have any learning problems. 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 He started having a hard time functioning sufficiently in that capacity and switched jobs, he was most recently working at the [employer]. He has always been a reliable and good worker as per his sister.   Psychiatric History: The patient has a history of multi-year mood episodes for much of his life, although he has always been quite functional. He first became incapacitated of his symptoms in 2007, after his younger brother committed suicide. He became very depressed, was "in bed for 3 years," and sought some mental health treatment. There was one additional episode where he attempted to end his life, which they thought was in 2007 or 2008 too, but described it as a separate  episode. Eventually he recovered and resumed functioning until his current episode of care. He has been hospitalized several times, at three different hospitals in 2020. The last admission was at [facility], in October 2020. He is currently following with Dr. Jennelle Frye with a working diagnosis of Bipolar disorder. He is on Lithium monotherapy. The patient denied any current suicidal ideation.   Substance Use History: The patient doesn't drink. He quit smoking cigarettes this year, apparently he bought a [place] Charon book on how to quit smoking and stopped at that time. He doesn't use any illicit substances.   Relationship History and Living Cimcumstances: The patient was married previously for 16 years, he stated that he "couldn't take it anymore," because his wife was a "very negative person." They were divorced in 2010. He has no children.   Mental Status and Behavioral Observations  Sensorium/Arousal: The patient's level of arousal was awake and alert. Hearing and vision were adequate for testing purposes. Orientation: The patient was fully oriented to person, place, time, and situation.  Appearance: Dressed in appropriate, casual clothing with reasonable grooming and hygiene Behavior: Pleasant, appropriate, quite flat and didn't interact much unless interacted with Speech/language: Slow in rate, no frank hypophonia, intact prosody. No word finding pauses or paraphasic errors.  Gait/Posture: Did have a shuffling gait with reduced armswing and small steps Movement: The patient was somewhat hypokinetic but had no rest tremor or frankly masked facies Social Comportment: Appropriate Mood: Not described well by the patient, endorsed depression when asked Affect: Blunted Thought process/content: Thought process was difficult to assess given limited verbal output, but he seemed able to follow the topics that were discussed during the encounter.  Safety: The patient denied any thoughts of harming himself  or others on direct questioning. He reported that he is a man of faith.  Insight: Limited  MMSE - Mini Mental State Exam 12/28/2019 11/02/2019  Orientation to time 4 1  Orientation to Place 4 2  Registration 3 3  Attention/ Calculation 3 3  Recall 3 3  Language- name 2 objects 2 2  Language- repeat 1 1  Language- follow 3 step command 2 2  Language- read & follow direction 1 0  Write a sentence 1 1  Copy design 1 1  Total score 25 19    Test Procedures  Wide Range Achievement Test - 4   Word Reading Wechsler Adult Intelligence Scale - IV  Digit Span  Arithmetic  Symbol Search  Coding Repeatable Battery for the Assessment of Neuropsychological Status (Form A) ACS Word Choice The Dot Counting Test Controlled Oral Word Association (F-A-S) Semantic Fluency (Animals) Trail Making Test A & B Wisconsin Card Sorting Test [phone] Patient Health Questionnaire - 9  GAD-7  Plan  Dawson BillsDavid S Malay was seen for a psychiatric diagnostic evaluation and neuropsychological testing. He has a history of significant (presumably psychiatric) decompensation in 2019 and a life-long history of mood instability, resulting in multi-year high and low periods. During the course of his treatment, he developed significant cognitive impairment, psychosis, and formed visual hallucinations. He was also having auditory hallucinations and Parkinsonian type movement problems. He has been taken off all antidopaminergic medications and is now on Li2+ monotherapy and is doing much better from a cognitive perspective, as per his sister, although he continues with significantly depressed mood and low activity level. His sister said he is spending 23 hours a day in bed. His cognitive screening lends some support to the notion that he is improving, as he is doing much better on the MMSE (6 points), scoring in the near normal range. Full and complete note with impressions, recommendations, and interpretation of test data to follow.    Bettye BoeckPeter V. Roseanne RenoStewart, PsyD, ABN Clinical Neuropsychologist  Informed Consent and Coding/Compliance  Risks and benefits of the evaluation were discussed with the patient prior to all testing procedures. I conducted a clinical interview and neuropsychological testing (at least two tests) with Dawson Billsavid S Pardo and Clare CharonKimberly Shuttleworth, B.S. (Technician) administered additional test procedures. The patient was able to tolerate the testing procedures and the patient (and/or family if applicable) is likely to benefit from further follow up to receive the diagnosis and treatment recommendations, which will be rendered at the next encounter. Billing below reflects technician time, my direct face-to-face time with the patient, time spent in test administration, and time spent in professional activities including but not limited to: neuropsychological test interpretation, integration of neuropsychological test data with clinical history, report preparation, treatment planning, care coordination, and review of diagnostically pertinent medical history or studies.   Services associated with this encounter: Clinical Interview [phone]([id number]) plus 60 minutes ([id number](96132; Neuropsychological Evaluation by Professional)  160 minutes ([id number]; Neuropsychological Evaluation by Professional, Adl.) 17 minutes ([id number](96136; Test Administration by Professional) 30 minutes ([id number](96138; Neuropsychological Testing by Technician) 75 minutes ([id number](96139; Neuropsychological Testing by Technician, Adl.)

## 2019-12-28 NOTE — Progress Notes (Signed)
   Psychometrist Note   Cognitive testing was administered to William Frye by Lamar Benes, B.S. (Technician) under the supervision of Alphonzo Severance, Psy.D., ABN. [name] was able to tolerate all test procedures. Dr. Nicole Kindred met with the patient as needed to manage any emotional reactions to the testing procedures. Rest breaks were offered.    The battery of tests administered was selected by Dr. Nicole Kindred with consideration to the patient's current level of functioning, the nature of his symptoms, emotional and behavioral responses during the interview, level of literacy, observed level of motivation/effort, and the nature of the referral question. This battery was communicated to the psychometrist. Communication between Dr. Nicole Kindred and the psychometrist was ongoing throughout the evaluation and Dr. Nicole Kindred was immediately accessible at all times. Dr. Nicole Kindred provided supervision to the technician on the date of this service, to the extent necessary to assure the quality of all services provided.    Mr. Ricke will return in approximately one week for an interactive feedback session with Dr. Nicole Kindred, at which time test performance, clinical impressions, and treatment recommendations will be reviewed in detail. The patient understands he can contact our office should he require our assistance before this time.   A total of 105 minutes of billable time were spent with William Frye by the technician, including test administration and scoring time. Billing for these services is reflected in Dr. Les Pou note.   This note reflects time spent with the psychometrician and does not include test scores, clinical history, or any interpretations made by Dr. Nicole Kindred. The full report will follow in a separate note.

## 2019-12-29 ENCOUNTER — Telehealth: Payer: Self-pay | Admitting: Neurology

## 2019-12-29 DIAGNOSIS — F0280 Dementia in other diseases classified elsewhere without behavioral disturbance: Secondary | ICD-10-CM

## 2019-12-29 DIAGNOSIS — G3183 Dementia with Lewy bodies: Secondary | ICD-10-CM

## 2019-12-29 NOTE — Progress Notes (Signed)
NEUROPSYCHOLOGICAL TEST SCORES [facility]  Patient Name: ZEDDIE NJIE MRN: [id number] Date of Birth: 12-07-1955 Age: 64 y.o. Education: 12 years  Measurement properties of test scores: IQ, Index, and Standard Scores (SS): Mean = 100; Standard Deviation = 15 Scaled Scores (Ss): Mean = 10; Standard Deviation = 3 Z scores (Z): Mean = 0; Standard Deviation = 1 T scores (T); Mean = 50; Standard Deviation = 10  TEST SCORES:    Note: This summary of test scores accompanies the interpretive report and should not be interpreted by unqualified individuals or in isolation without reference to the report. Test scores are relative to age, gender, and educational history as available and appropriate.   Performance Validity        ACS: Raw Descriptor      Word Choice: 45 Within Expectation      The Dot Counting Test: Raw Descriptor      E-Score 11 Within Expectation      Embedded Measures: Raw Descriptor      RBANS Effort Index: 0 Within Expectation      WAIS-IV Reliable Digit Span 9 Within Expectation      WAIS-IV Reliable Digit Span Revised 13 Within Expectation      Expected Functioning        Wide Range Achievement Test (Word Reading): Standard/Scaled Score Percentile       Word Reading 109 73      Cognitive Testing        RBANS, Form : Standard/Scaled Score Percentile  Total Score 87 19  Immediate Memory 83 13      List Learning 3 1      Story Memory 11 63  Visuospatial/Constructional 112 79      Figure Copy   (19) 11 63      Judgment of Line Orientation   (19) --- >75  Language 96 39      Picture Naming --- 17-25      Semantic Fluency 8 25  Attention 79 8      Digit Span 8 25      Coding 5 5  Delayed Memory 86 18      List Recall   (1) --- 3-9      List Recognition   (18) --- 10-16      Story Recall   (12) 15 95      Figure Recall   (14) 11 63      Wechsler Adult Intelligence Scale - IV: Standard/Scaled Score Percentile  Working Memory Index 97 42       Digit Span 10 50          Digit Span Forward 11 63          Digit Span Backward 8 25          Digit Span Sequencing 10 50      Arithmetic 9 37  Processing Speed Index 74 4      Symbol Search 4 2      Coding 6 9      Neuropsychological Assessment Battery (Language Module): T-score Percentile      Naming   (30) 55 69      Verbal Fluency: T-score Percentile      Controlled Oral Word Association (F-A-S) 29 2      Semantic Fluency (Animals) 44 27      Trail Making Test: T-Score Percentile      Part A 38 12      Part B  33 5      Modified Wisconsin Card Sorting Test (MWCST): Standard/T-Score Percentile      Number of Categories Correct 19 <1      Number of Perseverative Errors 39 14      Number of Total Errors 24 <1      Percent Perseverative Errors 54 66  Executive Function Composite 65 1      Boston Diagnostic Aphasia Exam: Raw Score Scaled Score      Complex Ideational Material 11 9      Clock Drawing Raw Score Descriptor      Command 8 Borderline Impairment      Rating Scales         Raw Score Descriptor  Patient Health Questionnaire - 9 12 Moderate  GAD-7 18 Severe      Functional Activities Questionnaire Raw Score Descriptor     Total Score 22 Dementia   Zaydyn Havey V. Roseanne Reno PsyD, ABN Clinical Neuropsychologist

## 2019-12-29 NOTE — Telephone Encounter (Signed)
I called and talk with his sister.  The MRI of the brain is relatively unremarkable, there is some question of elevation in size of the ventricles, this appears to be relatively minimal in my opinion.  I will go ahead and get a DaTscan as we had discussed originally in the office.  The patient is also had neuropsychological testing done yesterday, the results should be available soon.  The concern is that patient may have Lewy body dementia.  MRI brain 12/28/19:  IMPRESSION: This MRI of the brain with and without contrast shows the following: 1.   Brain parenchyma is normal for age.   2.   The third and lateral ventricles, including the temporal horns, are mildly enlarged out of proportion to the extent of minimal generalized cortical atrophy.  This could be incidental but could also be seen with mild normal pressure hydrocephalus 3.    No acute findings.  Normal enhancement pattern.

## 2020-01-04 ENCOUNTER — Ambulatory Visit (INDEPENDENT_AMBULATORY_CARE_PROVIDER_SITE_OTHER): Payer: Medicaid Other | Admitting: Counselor

## 2020-01-04 ENCOUNTER — Encounter: Payer: Self-pay | Admitting: Counselor

## 2020-01-04 ENCOUNTER — Other Ambulatory Visit: Payer: Self-pay

## 2020-01-04 DIAGNOSIS — G3184 Mild cognitive impairment, so stated: Secondary | ICD-10-CM | POA: Diagnosis not present

## 2020-01-04 DIAGNOSIS — F315 Bipolar disorder, current episode depressed, severe, with psychotic features: Secondary | ICD-10-CM | POA: Diagnosis not present

## 2020-01-04 NOTE — Progress Notes (Signed)
NEUROPSYCHOLOGICAL EVALUATION [facility]  Patient Name: William Frye MRN: [id number] Date of Birth: Oct 18, 1955 Age: 64 y.o. Education: 12 years  Clinical Impressions  William Frye is a 64 y.o., right-hand dominant, divorced man with a history of mental illness thought to be bipolar spectrum disorder. He has had mood episodes lasting several years for much of his life, although he has typically maintained his functioning. He did have an episode where he was incapacitated by depression after his younger brother committed suicide, in 2007/2008. He was nonetheless able to recover and return to work. He was in his normal state of health until 2019 when he had an abrupt decompensation, was not able to continue working, and had to move in with his sister. He has essentially been "in bed" since that time. He is depressed and has been disorganized. He has been followed by Dr. Jennelle Human with Psychiatry who has trialed a number of different medications and he developed Parkinsonism and started having visual/auditory hallucinations during a trial of Haldol. That was discontinued in August, 2021 although his Parkinsonism continues and concerns have been raised about Lewy Body disease. I see that he had an MRI since his initial appointment, which was essentially unremarkable. Radiology did comment on some ventriculomegaly, which was felt to be out of proportion to the extent of atrophy. To my eye, the ventriculomegaly is not particularly striking and there are no other specific imaging features that make be concerned about NPH.   On neuropsychological assessment, William Frye demonstrated mainly frontal-subcortical difficulties, with low scores on measures of processing speed, phonemic verbal fluency, and executive function. He had some low memory test scores but the profile is more consistent with executive interference than it is a memory storage problem given better recognition as compared to delayed free  recall for a word list and better acquisition and retention of structured as compared to unstructured information. There are no naming problems or indications of visuospatial impairment. He screened in the moderate range for depressive symptoms and in the severe range for anxiety symptoms.   William Frye is thus manifesting some level of cognitive impairment, with test findings suggesting primary frontal-subcortical difficulties. These findings could certainly be explained by his psychiatric condition and medication side effects, MCI in PD, or perhaps even minimally sympomatic NPH although I think the latter condition is unlikely. While frontal subcortical difficulties are also characteristic of Lewy Body disease, he does not have cortical neuropsychological findings that I would expect to see , such as profoundly impaired visuospatial abilities and that was in fact his best area of performance. This certainly doesn't rule out the possibility of the condition but may place it lower on the differential. He also has not had recurrence of his dream enactment behavior or hallucinations since getting off the Haldol. His Parkinsonism continues but he is still well within the window over which recovery is possible (6 months). Because his psychiatric disorder seems to be the principle cause of day-to-day impairment, the term mild neurocognitive disorder seems most descriptive, although he is realizing a level of functional impairment that would be expected in the setting of a major neurocognitive level problem.   Diagnostic Impressions: Mild neurocognitive disorder Bipolar affective disorder  Test Findings  Test scores are summarized in additional documentation associated with this encounter. Test scores are relative to age, gender, and educational history as available and appropriate. There were no concerns about performance validity as all findings fell within normal expectations.   General Intellectual  Functioning/Achievement:  Performance on single word reading was toward the upper aspect of the average range, with average to high average presenting as reasonable estimates of prior functioning.   Attention and Processing Efficiency: Performance on indicators of working memory and attention was within normal limits with an average score on the overall Working Memory Index and average scores on measures of digit repetition forward, backward, digit resequencing in ascending order, and mental solving of arithmetical word problems without paper and pencil.   Performance on indicators of processing speed suggested diminished capacities, with an unusually low score on the Working Memory Index of the WAIS-IV. He demonstrated unusually low scores on two different measures of timed number-symbol coding and an extremely low score on one measure of efficient visual scanning and efficient visual matching.   Language: Language findings showed intact visual object confrontation naming. Generation of words was extremely low in response to the letters F-A-S although his animal fluency was average.   Visuospatial Function: Performance on visuospatial measures was good, and in fact represented William Frye best area of performance, with a high average score on the overall index. Figure copy was average whereas judgment of angular line orientations was high average.   Learning and Memory: Indicators of learning and memory showed intact abilities, albeit with a profile suggesting marked executive interference. His learning and retention for structured information was much better than his learning and retention for unstructured verbal information. He also did somewhat better on delayed recognition of unstructured verbal information as compared to free recall. The profile is not consistent with a memory storage type problem.   In the verbal realm, William Frye demonstrated extremely low immediate recall for a 10-item  word list across three learning trials followed by unusually low delayed recall performance. His recognition for the information was somewhat better, however, with a low average score. Memory for structured information was good with average immediate recall of a short story and superior delayed recall.   In the visual realm, delayed recall for a modestly complex visual stimulus was average.   Executive Functions: Performance on executive indicators was below expectation, with an extremely low score on the Executive Function Composite from a rule-based categorization procedure emphasizing card sorting. Generation of words in response to the letters F-A-S was extremely low and alternating sequencing of numbers and letters of the alphabet was unusually low. Clock drawing was consistent with "borderline" impairment. He did better when reasoning with verbal information on the complex ideational material, with an average score.   Rating Scale(s): Mr. Boomer screened in the severe range for anxiety symptoms and in the moderate range for depressive symptoms. He was characterized as quite functionally incapacitated on the FAQ, with a total score suggestive of a major neurocognitive level problem and dependence on others in many areas.   Bettye Boeck Roseanne Reno PsyD, ABN Clinical Neuropsychologist

## 2020-01-04 NOTE — Progress Notes (Signed)
   NEUROPSYCHOLOGY TELEMEDICINE NOTE [facility]  Telemedicine statement:  I discussed the limitations of neuropsychological care via telemedicine and the availability of in person appointments. The patient expressed understanding and agreed to proceed. The patient was verified with two identifiers.  The visit modality was: telephonic The patient location was: home The provider location was: office  The following individuals participated: William Frye [name] [place]  Feedback Note: I met with William Frye and his family to review the findings resulting from his neuropsychological evaluation. Since the last appointment, he has been about the same. Time was spent reviewing the impressions and recommendations that are detailed in the evaluation report. We discussed impression of mild neurocognitive disorder, mainly involving frontal-subcortical problems. While this could potentially represent a Lewy Body spectrum disorder such as PD, it is less consistent with Lewy Body Dementia, given a lack of cortical signs and symptoms expected in the setting of that condition (I.e., visuospatial impairment). We further discussed his upcoming SPECT DaT scan, the importance of behavioral activation and routine, and the like. I took time to explain the findings and answer all the patient's questions. I encouraged William Frye to contact me should he have any further questions or if further follow up is desired.   Current Medications and Medical History   Current Outpatient Medications  Medication Sig Dispense Refill  . aspirin 81 MG EC tablet Take 81 mg by mouth daily. Swallow whole.    . lithium 600 MG capsule Take 1 capsule by mouth once daily 90 capsule 0  . Multiple Vitamins-Minerals (MENS MULTIVITAMIN PLUS PO) Take by mouth.    . Omega-3 Fatty Acids (FISH OIL) 1000 MG CAPS Take by mouth.    . Saccharomyces boulardii (PROBIOTIC) 250 MG CAPS Take by mouth.      No  current facility-administered medications for this visit.    Patient Active Problem List   Diagnosis Date Noted  . MDD (major depressive disorder) 01/05/2019  . Bipolar 1 disorder ([place]) 01/05/2019    Mental Status and Behavioral Observations  William Frye was available at the prespecified time for this telephonic appointment and was alert and generally oriented (orientation not formally assessed). Speech was normal in rate, rhythm, volume, and prosody. Self-reported mood was "ok" and affect as assessed by vocal quality was neutral to depressed. Thought process was difficult to assess given limited spontaneous speech, although he did seem to track the topics that were discussed. Thought content was appropriate. There were no safety concerns identified at today's encounter, such as thoughts of harming self or others, when he was asked directly.    Plan  Feedback provided regarding the patient's neuropsychological evaluation. He could have an early Lewy Body spectrum disorder although that is not clear without further information and there are alternative explanations for most of his symptoms. He is in the process of getting SPECT DaT scan. William Frye was encouraged to contact me if any questions arise or if further follow up is desired.   Viviano Simas Nicole Kindred, PsyD, ABN Clinical Neuropsychologist  Service(s) Provided at This Encounter: 46 minutes [phone]; Conjoint therapy with patient present)

## 2020-01-04 NOTE — Patient Instructions (Addendum)
Your performance and presentation on assessment were consistent with mainly frontal-subcortical difficulties. This is the term for memory and thinking problems involving diminished cognitive efficiency. You also have very significant functional impairment. Given that you have a significant and currently active psychiatric condition and that seems to be the principle cause of day-to-day impairment, I believe the term Mild Neurocognitive Disorder is the best diagnosis at this point in time.   I discussed with you that Lewy Body dementia typically presents with visuospatial impairment of a profound nature. Your performance on visuospatial measures was, in fact, your best area of performance. This certainly does not rule out the possibility of Lewy Body Dementia, although it makes it lower on the differential from a neuropsychological perspective. Your cognitive test scores also are less suggestive of a dementia level of problem and suggest more an MCI level of dysfunction, although that is a clinical diagnosis that cannot be made on the basis of test data.   With respect to what is causing your symptoms, we talked about the differential thus far, in terms of psychiatric issues vs. Parkinsonism in the setting of antidopaminergic medication vs. Primary Parkinsonism. It is crucially important to find out if there is a primary cause for your Parkinsonism to guide treatment and prognosis.   The SPECT DaT scan is the next logical step in your workup and has been ordered by Dr. Earlene Plater. We discussed that this is a special type of brain scan that rules in or out a primary cause for your Parkinsonism (such as Lewy Body dementia, or Parkinson's disease). You will follow up with radiology to obtain that study. The accuracy of this study is high, in the range of 98-99% as compared against autopsy proven Parkinson's disease in some large scale studies.   You had questions about whether ECT may be appropriate for Shayaan  because you are concerned about trying further medications due to his negative reactions in the past. I provided you with psychoeducation on ECT, which is often an excellent treatment for very severe or disabling depression. Even if there were a primary cause for your Parkinsonism, there is no doubt a significant affective component to your difficulties, and to my mind this wouldn't entirely rule out the possibility of ECT. You would need to discuss that with Dr. Jennelle Human because I am not an ECT practitioner or medical provider. We also discussed different classes of medications and that not all medications have a high risk of adverse events, particularly medications for depression.   There is a significant research base and evidence of effectiveness for something called "behavioral activation," which is a fancy way of saying that you should increase your activity level. In general, people do not feel as happy or do as well when they are not doing much. This can include things like getting out for walks, re-engaging in hobbies, spending time with family or friends, or learning a new hobby. It's not so important what you do as that you enjoy it and stick with it. Depression can start a vicious cycle where you are not doing a lot because you don't feel well, which leads to less things to be excited and happy about, and thus more depression and behavioral avoidance. It can be hard to change this pattern once it has started but most people find that they feel better when they start doing more even if they don't enjoy it at first.

## 2020-02-20 ENCOUNTER — Encounter: Payer: Self-pay | Admitting: Family Medicine

## 2020-02-20 ENCOUNTER — Other Ambulatory Visit: Payer: Self-pay

## 2020-02-20 ENCOUNTER — Ambulatory Visit (INDEPENDENT_AMBULATORY_CARE_PROVIDER_SITE_OTHER): Payer: Medicaid Other | Admitting: Family Medicine

## 2020-02-20 VITALS — BP 118/70 | HR 60 | Ht 75.0 in | Wt 246.0 lb

## 2020-02-20 DIAGNOSIS — Z8659 Personal history of other mental and behavioral disorders: Secondary | ICD-10-CM | POA: Diagnosis not present

## 2020-02-20 DIAGNOSIS — F319 Bipolar disorder, unspecified: Secondary | ICD-10-CM | POA: Diagnosis not present

## 2020-02-20 MED ORDER — LITHIUM CARBONATE 600 MG PO CAPS
600.0000 mg | ORAL_CAPSULE | Freq: Every day | ORAL | 1 refills | Status: DC
Start: 2020-02-20 — End: 2020-08-16

## 2020-02-20 NOTE — Progress Notes (Signed)
Established Patient Office Visit  Subjective:  Patient ID: William Frye, male    DOB: 1955-07-05  Age: 64 y.o. MRN: [id number]  CC:  Chief Complaint  Patient presents with  . Follow-up  . Nausea    HPI William Frye presents for evaluation accompanied by his sister with whom he currently lives.  He had moved here couple years ago from [place] and has had history of some drug abuse apparently.  He had evidence for some hallucinations and psychosis and was referred to psychiatrist.  At one point was taking Haldol but was having increased hallucinations and some urine incontinence.  After tapering off Haldol his symptoms improved.  There has been concern for bipolar 1 disorder and he does remain on lithium.  Doing much better at this time.  He was referred to neurology and there was some concern raised for possible Lewy body dementia.  He was seen by the neuropsychologist and diagnosed with mild cognitive disorder.  Was felt this may potentially represent a Lewy body spectrum disorder.  He had MRI of the brain last October.  He has upcoming nuclear medicine DAT scan  Basically, sister is inquiring whether we could take over prescribing his lithium.  He has been seen by psychiatry in the past but will have increased co-pays and expense with that in the future.  Apparently also his psychiatrist does not take Medicaid.  He has had relatively recent thyroid functions that were stable.  Recent electrolytes stable.  Seems to doing well with the lithium and is on fairly low dose 600 mg once daily.  Family have had some concerns he may have some ongoing depression issues.  Patient is very reluctant to consider other medication at this time.  More than anything he seems fidgety at times but not agitated and certainly not aggressive.  Have not noticed any recent hallucinations or delusional type behavior.  Fairly low motivation.  No suicidal ideation.  Past Medical History:  Diagnosis Date   . Depression   . Emphysema with chronic bronchitis (HCC)   . Frequent headaches   . Hallucinations     No past surgical history on file.  Family History  Problem Relation Age of Onset  . Hyperlipidemia Mother   . COPD Father   . Cancer Father   . Depression Brother   . Drug abuse Brother   . Early death Brother   . Mental illness Brother   . Depression Sister   . Seizures Neg Hx   . Parkinson's disease Neg Hx   . Dementia Neg Hx     Social History   Socioeconomic History  . Marital status: Divorced    Spouse name: Not on file  . Number of children: Not on file  . Years of education: Not on file  . Highest education level: High school graduate  Occupational History  . Not on file  Tobacco Use  . Smoking status: Former Smoker    Years: 30.00    Types: Cigarettes    Quit date: 03/2018    Years since quitting: 1.9  . Smokeless tobacco: Never Used  Vaping Use  . Vaping Use: Never used  Substance and Sexual Activity  . Alcohol use: Not Currently  . Drug use: Never  . Sexual activity: Not Currently    Partners: Female  Other Topics Concern  . Not on file  Social History Narrative   Lives with sister   Right handed   Caffeine: 1-2 cups/day  Social Determinants of Health   Financial Resource Strain: Not on file  Food Insecurity: Not on file  Transportation Needs: Not on file  Physical Activity: Not on file  Stress: Not on file  Social Connections: Not on file  Intimate Partner Violence: Not on file    Outpatient Medications Prior to Visit  Medication Sig Dispense Refill  . aspirin 81 MG EC tablet Take 81 mg by mouth daily. Swallow whole.    . Multiple Vitamins-Minerals (MENS MULTIVITAMIN PLUS PO) Take by mouth.    . Omega-3 Fatty Acids (FISH OIL) 1000 MG CAPS Take by mouth.    . Saccharomyces boulardii (PROBIOTIC) 250 MG CAPS Take by mouth.     . lithium 600 MG capsule Take 1 capsule by mouth once daily 90 capsule 0   No facility-administered  medications prior to visit.    Allergies  Allergen Reactions  . Lithium Diarrhea and Nausea Only  . Seroquel [Quetiapine Fumarate]     Psychosis  . Aripiprazole Other (See Comments)    Auditory and visual hallucinations    ROS Review of Systems  Constitutional: Negative for appetite change and unexpected weight change.  Respiratory: Negative for shortness of breath.   Cardiovascular: Negative for chest pain.  Neurological: Negative for headaches.  Psychiatric/Behavioral: Positive for dysphoric mood. Negative for agitation, confusion and suicidal ideas.      Objective:    Physical Exam Vitals reviewed.  Cardiovascular:     Rate and Rhythm: Normal rate and regular rhythm.  Pulmonary:     Effort: Pulmonary effort is normal.     Breath sounds: Normal breath sounds.  Neurological:     General: No focal deficit present.     Mental Status: He is alert.  Psychiatric:        Mood and Affect: Mood normal.        Behavior: Behavior normal.     BP 118/70 (BP Location: Left Arm, Cuff Size: Normal)   Pulse 60   Ht 6\' 3"  (1.905 m)   Wt 246 lb (111.6 kg)   SpO2 96%   BMI 30.75 kg/m  Wt Readings from Last 3 Encounters:  02/20/20 246 lb (111.6 kg)  11/02/19 220 lb (99.8 kg)  09/06/19 232 lb 12.8 oz (105.6 kg)     Health Maintenance Due  Topic Date Due  . Hepatitis C Screening  Never done  . COVID-19 Vaccine (1) Never done  . TETANUS/TDAP  Never done  . COLONOSCOPY  Never done  . INFLUENZA VACCINE  Never done    There are no preventive care reminders to display for this patient.  Lab Results  Component Value Date   TSH 2.13 08/23/2019   Lab Results  Component Value Date   WBC 7.4 08/23/2019   HGB 15.1 08/23/2019   HCT 43.4 08/23/2019   MCV 90.8 08/23/2019   PLT 283.0 08/23/2019   Lab Results  Component Value Date   NA 137 08/23/2019   K 4.1 08/23/2019   CO2 26 08/23/2019   GLUCOSE 108 (H) 08/23/2019   BUN 10 08/23/2019   CREATININE 0.88 08/23/2019    BILITOT 1.0 08/23/2019   ALKPHOS 146 (H) 08/23/2019   AST 17 08/23/2019   ALT 15 08/23/2019   PROT 6.3 08/23/2019   ALBUMIN 4.2 08/23/2019   CALCIUM 9.6 08/23/2019   ANIONGAP 10 01/04/2019   GFR 87.69 08/23/2019   No results found for: CHOL No results found for: HDL No results found for: LDLCALC No results found for:  TRIG No results found for: CHOLHDL No results found for: YTKZ6W    Assessment & Plan:   Problem List Items Addressed This Visit   None   Visit Diagnoses    Bipolar I disorder (HCC)       Relevant Medications   lithium 600 MG capsule    Patient has history of mild cognitive disorder.  We agreed to take over prescribing his lithium but explained that at least once yearly he would need to have thyroid functions and electrolytes checked.  We went ahead and refilled his lithium for 6 months.  -They are encouraged to continue close follow-up with psychiatry  -We did discuss possible use of low-dose antidepressant such as Lexapro but at this point patient declines.  Meds ordered this encounter  Medications  . lithium 600 MG capsule    Sig: Take 1 capsule (600 mg total) by mouth daily.    Dispense:  90 capsule    Refill:  1    Follow-up: Return in about 6 months (around 08/20/2020).    Evelena Peat, MD

## 2020-02-21 ENCOUNTER — Encounter (HOSPITAL_COMMUNITY)
Admission: RE | Admit: 2020-02-21 | Discharge: 2020-02-21 | Disposition: A | Discharge status: Home / Self Care | Payer: Medicaid Other | Source: Ambulatory Visit | Attending: Neurology | Admitting: Neurology

## 2020-02-21 ENCOUNTER — Telehealth: Payer: Self-pay | Admitting: Neurology

## 2020-02-21 DIAGNOSIS — G3183 Dementia with Lewy bodies: Secondary | ICD-10-CM | POA: Diagnosis present

## 2020-02-21 DIAGNOSIS — F0280 Dementia in other diseases classified elsewhere without behavioral disturbance: Secondary | ICD-10-CM | POA: Insufficient documentation

## 2020-02-21 MED ORDER — IOFLUPANE I 123 185 MBQ/2.5ML IV SOLN
5.0000 | Freq: Once | INTRAVENOUS | Status: AC | PRN
  Administered 2020-02-21: 10:00:00 5 via INTRAVENOUS
  Filled 2020-02-21: qty 5

## 2020-02-21 MED ORDER — POTASSIUM IODIDE (ANTIDOTE) 130 MG PO TABS: 130.0000 mg | ORAL_TABLET | Freq: Once | ORAL | Status: AC

## 2020-02-21 MED ORDER — POTASSIUM IODIDE (ANTIDOTE) 130 MG PO TABS
ORAL_TABLET | ORAL | Status: AC
  Administered 2020-02-21: 09:00:00 130 mg via ORAL
  Filled 2020-02-21: qty 1

## 2020-02-21 NOTE — Telephone Encounter (Signed)
I called and talk with the sister.  The DaTscan was equivocal, could be associated with an early parkinsonian feature.  Neuropsychological evaluation was not fully typical for Lewy body dementia but could still be in the differential.  His sister claims that the walking is improved off of the Haldol.  We will need to follow the cognitive issues and the walking over time.   The patient has an appointment in January 2022.   DAT scan 02/21/20:  IMPRESSION: Equivocal exam with mild truncation of the most posterior putamen on LEFT and RIGHT. Findings could indicate early Parkinsonian syndrome pathology. Consider repeat imaging at some future point.  Of note, DaTSCAN is not diagnostic of Parkinsonian syndromes, which remains a clinical diagnosis. DaTscan is an adjuvant test to aid in the clinical diagnosis of Parkinsonian syndromes.

## 2020-02-22 ENCOUNTER — Ambulatory Visit (HOSPITAL_COMMUNITY): Admission: RE | Admit: 2020-02-22 | Payer: Medicaid Other | Source: Ambulatory Visit

## 2020-02-22 ENCOUNTER — Ambulatory Visit (HOSPITAL_COMMUNITY): Payer: Medicaid Other

## 2020-03-21 ENCOUNTER — Ambulatory Visit: Payer: Medicaid Other | Admitting: Neurology

## 2020-08-08 ENCOUNTER — Ambulatory Visit: Payer: Medicaid Other | Admitting: Neurology

## 2020-08-16 ENCOUNTER — Telehealth (INDEPENDENT_AMBULATORY_CARE_PROVIDER_SITE_OTHER): Payer: Medicare Other | Admitting: Family Medicine

## 2020-08-16 ENCOUNTER — Other Ambulatory Visit: Payer: Self-pay

## 2020-08-16 ENCOUNTER — Telehealth: Payer: Self-pay | Admitting: Family Medicine

## 2020-08-16 DIAGNOSIS — F319 Bipolar disorder, unspecified: Secondary | ICD-10-CM | POA: Diagnosis not present

## 2020-08-16 DIAGNOSIS — Z79899 Other long term (current) drug therapy: Secondary | ICD-10-CM

## 2020-08-16 MED ORDER — LITHIUM CARBONATE 600 MG PO CAPS: 600.0000 mg | ORAL_CAPSULE | Freq: Every day | ORAL | 1 refills | Status: DC

## 2020-08-16 NOTE — Progress Notes (Signed)
Patient ID: William Frye, male   DOB: [birth year], 65 y.o.   MRN: [id number]  This visit type was conducted due to national recommendations for restrictions regarding the COVID-19 pandemic in an effort to limit this patient's exposure and mitigate transmission in our community.   Virtual Visit via Video Note  I connected with William Frye on 08/16/20 at 10:00 AM EDT by a video enabled telemedicine application and verified that I am speaking with the correct person using two identifiers.  Location patient: home Location provider:work or home office Persons participating in the virtual visit: patient, provider  I discussed the limitations of evaluation and management by telemedicine and the availability of in person appointments. The patient expressed understanding and agreed to proceed.   HPI:  William Frye has history of bipolar disorder.  He is currently doing very well on lithium 600 mg daily.  Compliant with medications.  He has oversight by sister.  He needs refills of medication.  His last labs for thyroid and electrolytes were a year ago.  No recent lithium level.  Generally doing very well.  He had been overmedicated at 1 point when he was seeing psychiatry and they are pleased with his current status.  No recent manic type symptoms.  No major depressive symptoms.   ROS: See pertinent positives and negatives per HPI.  Past Medical History:  Diagnosis Date   Depression    Emphysema with chronic bronchitis (HCC)    Frequent headaches    Hallucinations     No past surgical history on file.  Family History  Problem Relation Age of Onset   Hyperlipidemia Mother    COPD Father    Cancer Father    Depression Brother    Drug abuse Brother    Early death Brother    Mental illness Brother    Depression Sister    Seizures Neg Hx    Parkinson's disease Neg Hx    Dementia Neg Hx     SOCIAL HX: Lives with sister.  Quit smoking 2020   Current Outpatient Medications:    aspirin 81 MG  EC tablet, Take 81 mg by mouth daily. Swallow whole., Disp: , Rfl:    Multiple Vitamins-Minerals (MENS MULTIVITAMIN PLUS PO), Take by mouth., Disp: , Rfl:    Omega-3 Fatty Acids (FISH OIL) 1000 MG CAPS, Take by mouth., Disp: , Rfl:    Saccharomyces boulardii (PROBIOTIC) 250 MG CAPS, Take by mouth. , Disp: , Rfl:    lithium 600 MG capsule, Take 1 capsule (600 mg total) by mouth daily., Disp: 90 capsule, Rfl: 1  EXAM:  VITALS per patient if applicable:  GENERAL: alert, oriented, appears well and in no acute distress  HEENT: atraumatic, conjunttiva clear, no obvious abnormalities on inspection of external nose and ears  NECK: normal movements of the head and neck  LUNGS: on inspection no signs of respiratory distress, breathing rate appears normal, no obvious gross SOB, gasping or wheezing  CV: no obvious cyanosis  MS: moves all visible extremities without noticeable abnormality  PSYCH/NEURO: pleasant and cooperative, no obvious depression or anxiety, speech and thought processing grossly intact  ASSESSMENT AND PLAN:  Discussed the following assessment and plan:  Bipolar 1 disorder (HCC)  Bipolar I disorder (HCC) - Plan: lithium 600 MG capsule  High risk medication use - Plan: TSH, CMP, Lithium level  -Set up future labs with TSH, comprehensive metabolic panel, lithium level and they will try to schedule these within the next couple months -Refill lithium  for 6 months -Consider EKG at next office visit   I discussed the assessment and treatment plan with the patient. The patient was provided an opportunity to ask questions and all were answered. The patient agreed with the plan and demonstrated an understanding of the instructions.   The patient was advised to call back or seek an in-person evaluation if the symptoms worsen or if the condition fails to improve as anticipated.     Evelena Peat, MD

## 2020-08-16 NOTE — Telephone Encounter (Signed)
Patient had a MyChart visit with Dr. Caryl Never today and there were lab orders placed for the patient to schedule in 2 months.

## 2020-08-21 ENCOUNTER — Ambulatory Visit: Payer: Medicaid Other | Admitting: Family Medicine

## 2020-09-25 ENCOUNTER — Other Ambulatory Visit (INDEPENDENT_AMBULATORY_CARE_PROVIDER_SITE_OTHER): Payer: Medicare Other

## 2020-09-25 ENCOUNTER — Other Ambulatory Visit: Payer: Self-pay

## 2020-09-25 DIAGNOSIS — Z79899 Other long term (current) drug therapy: Secondary | ICD-10-CM

## 2020-09-25 LAB — COMPREHENSIVE METABOLIC PANEL
ALT: 16 U/L (ref 0–53)
AST: 19 U/L (ref 0–37)
Albumin: 4.3 g/dL (ref 3.5–5.2)
Alkaline Phosphatase: 107 U/L (ref 39–117)
BUN: 15 mg/dL (ref 6–23)
CO2: 21 mEq/L (ref 19–32)
Calcium: 9.1 mg/dL (ref 8.4–10.5)
Chloride: 106 mEq/L (ref 96–112)
Creatinine, Ser: 0.97 mg/dL (ref 0.40–1.50)
GFR: 83.19 mL/min (ref >60.00)
Glucose, Bld: 161 mg/dL — ABNORMAL HIGH (ref 70–99)
Potassium: 3.9 mEq/L (ref 3.5–5.1)
Sodium: 137 mEq/L (ref 135–145)
Total Bilirubin: 0.7 mg/dL (ref 0.2–1.2)
Total Protein: 7.1 g/dL (ref 6.0–8.3)

## 2020-09-25 LAB — TSH: TSH: 1.68 u[IU]/mL (ref 0.35–5.50)

## 2020-09-26 LAB — LITHIUM LEVEL: Lithium Lvl: 0.3 mmol/L — ABNORMAL LOW (ref 0.6–1.2)

## 2020-11-06 ENCOUNTER — Other Ambulatory Visit: Payer: Self-pay

## 2020-11-06 ENCOUNTER — Ambulatory Visit (INDEPENDENT_AMBULATORY_CARE_PROVIDER_SITE_OTHER): Payer: Medicare Other | Admitting: Family Medicine

## 2020-11-06 ENCOUNTER — Encounter: Payer: Self-pay | Admitting: Family Medicine

## 2020-11-06 VITALS — BP 120/80 | HR 72 | Temp 98.2°F | Wt 261.7 lb

## 2020-11-06 DIAGNOSIS — R7309 Other abnormal glucose: Secondary | ICD-10-CM | POA: Diagnosis not present

## 2020-11-06 DIAGNOSIS — R7303 Prediabetes: Secondary | ICD-10-CM

## 2020-11-06 LAB — POCT GLYCOSYLATED HEMOGLOBIN (HGB A1C): Hemoglobin A1C: 5.3 % (ref 4.0–5.6)

## 2020-11-06 NOTE — Progress Notes (Signed)
Established Patient Office Visit  Subjective:  Patient ID: William Frye, male    DOB: Oct 28, 1955  Age: 65 y.o. MRN: [id number]  CC: No chief complaint on file.   HPI ANTIONIO Frye presents for follow-up regarding recent abnormal blood sugar.  He had recent fasting labs with glucose 161.  We had noted prediabetes range blood sugars with readings of 103, 106, 108 prior to this.  Patient has occasional nocturia but no significant polyuria or polydipsia.  He had been drinking 2-3 Cokes per day and scaled this back himself about a month ago and has already lost about 10 pounds by his scales at home.  Based on recent labs above we had recommended follow-up for A1c.  He is here today for that.  He does eat a lot of starches with things like pizza and [place] fries and breads.  He is trying to scale back.  Past Medical History:  Diagnosis Date   Depression    Emphysema with chronic bronchitis (HCC)    Frequent headaches    Hallucinations     No past surgical history on file.  Family History  Problem Relation Age of Onset   Hyperlipidemia Mother    COPD Father    Cancer Father    Depression Brother    Drug abuse Brother    Early death Brother    Mental illness Brother    Depression Sister    Seizures Neg Hx    Parkinson's disease Neg Hx    Dementia Neg Hx     Social History   Socioeconomic History   Marital status: Divorced    Spouse name: Not on file   Number of children: Not on file   Years of education: Not on file   Highest education level: High school graduate  Occupational History   Not on file  Tobacco Use   Smoking status: Former    Years: 30.00    Types: Cigarettes    Quit date: 03/2018    Years since quitting: 2.6   Smokeless tobacco: Never  Vaping Use   Vaping Use: Never used  Substance and Sexual Activity   Alcohol use: Not Currently   Drug use: Never   Sexual activity: Not Currently    Partners: Female  Other Topics Concern   Not on file  Social  History Narrative   Lives with sister   Right handed   Caffeine: 1-2 cups/day   Social Determinants of Health   Financial Resource Strain: Not on file  Food Insecurity: Not on file  Transportation Needs: Not on file  Physical Activity: Not on file  Stress: Not on file  Social Connections: Not on file  Intimate Partner Violence: Not on file    Outpatient Medications Prior to Visit  Medication Sig Dispense Refill   aspirin 81 MG EC tablet Take 81 mg by mouth daily. Swallow whole.     lithium 600 MG capsule Take 1 capsule (600 mg total) by mouth daily. 90 capsule 1   Multiple Vitamins-Minerals (MENS MULTIVITAMIN PLUS PO) Take by mouth.     Omega-3 Fatty Acids (FISH OIL) 1000 MG CAPS Take by mouth.     Saccharomyces boulardii (PROBIOTIC) 250 MG CAPS Take by mouth.      No facility-administered medications prior to visit.    Allergies  Allergen Reactions   Lithium Diarrhea and Nausea Only   Seroquel [Quetiapine Fumarate]     Psychosis   Aripiprazole Other (See Comments)    Auditory  and visual hallucinations    ROS Review of Systems  Constitutional:  Negative for appetite change and unexpected weight change.  Respiratory:  Negative for shortness of breath.   Cardiovascular:  Negative for chest pain.  Endocrine: Negative for polydipsia and polyuria.     Objective:    Physical Exam Vitals reviewed.  Cardiovascular:     Rate and Rhythm: Normal rate and regular rhythm.  Pulmonary:     Effort: Pulmonary effort is normal.     Breath sounds: Normal breath sounds.  Neurological:     Mental Status: He is alert.    BP 120/80 (BP Location: Left Arm, Patient Position: Sitting, Cuff Size: Normal)   Pulse 72   Temp 98.2 F (36.8 C) (Oral)   Wt 261 lb 11.2 oz (118.7 kg)   SpO2 95%   BMI 32.71 kg/m  Wt Readings from Last 3 Encounters:  11/06/20 261 lb 11.2 oz (118.7 kg)  02/20/20 246 lb (111.6 kg)  11/02/19 220 lb (99.8 kg)     Health Maintenance Due  Topic Date Due    COVID-19 Vaccine (1) Never done   Pneumococcal Vaccine 67-25 Years old (1 - PCV) Never done   Hepatitis C Screening  Never done   TETANUS/TDAP  Never done   Zoster Vaccines- Shingrix (1 of 2) Never done   COLONOSCOPY (Pts 45-8yrs Insurance coverage will need to be confirmed)  Never done   INFLUENZA VACCINE  Never done    There are no preventive care reminders to display for this patient.  Lab Results  Component Value Date   TSH 1.68 09/25/2020   Lab Results  Component Value Date   WBC 7.4 08/23/2019   HGB 15.1 08/23/2019   HCT 43.4 08/23/2019   MCV 90.8 08/23/2019   PLT 283.0 08/23/2019   Lab Results  Component Value Date   NA 137 09/25/2020   K 3.9 09/25/2020   CO2 21 09/25/2020   GLUCOSE 161 (H) 09/25/2020   BUN 15 09/25/2020   CREATININE 0.97 09/25/2020   BILITOT 0.7 09/25/2020   ALKPHOS 107 09/25/2020   AST 19 09/25/2020   ALT 16 09/25/2020   PROT 7.1 09/25/2020   ALBUMIN 4.3 09/25/2020   CALCIUM 9.1 09/25/2020   ANIONGAP 10 01/04/2019   GFR 83.19 09/25/2020   No results found for: CHOL No results found for: HDL No results found for: LDLCALC No results found for: TRIG No results found for: CHOLHDL Lab Results  Component Value Date   HGBA1C 5.3 11/06/2020      Assessment & Plan:   Problem List Items Addressed This Visit       Unprioritized   Prediabetes   Other Visit Diagnoses     Elevated glucose    -  Primary   Relevant Orders   POCT glycosylated hemoglobin (Hb A1C) (Completed)     Patient had recent reported fasting glucose 161.  A1c today 5.3%.  He has already made some positive lifestyle changes and even though he has gained substantial weight this past year this is starting to reverse.  -We discussed diet in some detail.  Reduce high glycemic things especially with beverages.  He was drinking 2-3 Cokes per day and has eliminated those.  Still drinks about 8 ounces of orange juice per day and we recommend scaling back back as well.   Reduce white starches and consider 7-month follow-up.  Recommend at least yearly monitoring of A1c.  No clear indication for medications at this time.  No  orders of the defined types were placed in this encounter.   Follow-up: No follow-ups on file.    Carolann Littler, MD

## 2021-02-04 ENCOUNTER — Other Ambulatory Visit: Payer: Self-pay

## 2021-02-04 ENCOUNTER — Telehealth (INDEPENDENT_AMBULATORY_CARE_PROVIDER_SITE_OTHER): Payer: Medicare Other | Admitting: Family Medicine

## 2021-02-04 ENCOUNTER — Encounter: Payer: Self-pay | Admitting: Family Medicine

## 2021-02-04 VITALS — Temp 98.7°F | Wt 260.0 lb

## 2021-02-04 DIAGNOSIS — U071 COVID-19: Secondary | ICD-10-CM | POA: Diagnosis not present

## 2021-02-04 MED ORDER — NIRMATRELVIR/RITONAVIR (PAXLOVID)TABLET: 3.0000 | ORAL_TABLET | Freq: Two times a day (BID) | ORAL | 0 refills | Status: AC

## 2021-02-04 NOTE — Progress Notes (Signed)
Virtual Visit via Telephone Note  I connected with William Frye on 02/04/21 at  5:40 PM EST by telephone and verified that I am speaking with the correct person using two identifiers.   I discussed the limitations, risks, security and privacy concerns of performing an evaluation and management service by telephone and the availability of in person appointments. I also discussed with the patient that there may be a patient responsible charge related to this service. The patient expressed understanding and agreed to proceed.  Location patient: home, [place] Location provider: work or home office Participants present for the call: patient, provider Patient did not have a visit with me in the prior 7 days to address this/these issue(s).   History of Present Illness:  Acute telemedicine visit for Covid19: -Onset: 2 days ago; home covid test positive -mother is positive for covid as well -Symptoms include: nasal congestion, sore throat, cough, body aches, a little diarrhea -Denies:fevers, NV, CP other than some CP when coughs, SOB, inability to eat/drink -Pertinent past medical history:see below -Pertinent medication allergies:  Allergies  Allergen Reactions   Lithium Diarrhea and Nausea Only   Seroquel [Quetiapine Fumarate]     Psychosis   Aripiprazole Other (See Comments)    Auditory and visual hallucinations  -COVID-19 vaccine status:  There is no immunization history on file for this patient. No prior infection GFR was good in the end of July 83  Past Medical History:  Diagnosis Date   Depression    Emphysema with chronic bronchitis (HCC)    Frequent headaches    Hallucinations    Current Outpatient Medications on File Prior to Visit  Medication Sig Dispense Refill   lithium 600 MG capsule Take 1 capsule (600 mg total) by mouth daily. 90 capsule 1   Multiple Vitamins-Minerals (MENS MULTIVITAMIN PLUS PO) Take by mouth.     Omega-3 Fatty Acids (FISH OIL) 1000 MG CAPS Take by  mouth.     Saccharomyces boulardii (PROBIOTIC) 250 MG CAPS Take by mouth.      No current facility-administered medications on file prior to visit.   Observations/Objective: Patient sounds cheerful and well on the phone. I do not appreciate any SOB. Speech and thought processing are grossly intact. Patient reported vitals:  Assessment and Plan:  COVID-19   Discussed treatment options and risk of drug interactions, ideal treatment window, potential complications, isolation and precautions for COVID-19.  Discussed possibility of rebound with antivirals and the need to reisolate if it should occur for 5 days. Checked for/reviewed any labs done in the last 90 days with GFR listed in HPI if available. After lengthy discussion, the patient opted for treatment with Paxlovid due to being higher risk for complications of covid or severe disease and other factors. Discussed EUA status of this drug and the fact that there is preliminary limited knowledge of risks/interactions/side effects per EUA document vs possible benefits and precautions. This information was shared with patient during the visit and also was provided in patient instructions. Also, advised that patient discuss risks/interactions and use with pharmacist/treatment team as well. Other symptomatic care measures summarized in patient instructions. Work/School slipped offered: declined Advised to seek prompt in person care if worsening, new symptoms arise, or if is not improving with treatment. At the end of the visit when asked if anything else he said he will be due for a refill on his Lithium later this month. Did advise that he let the pharmacy know and I will send his PCP, Dr.  Burchette, a note letting him know in case he wishes to see the patient regarding this or refill.   Follow Up Instructions:  I did not refer this patient for an OV with me in the next 24 hours for this/these issue(s).  I discussed the assessment and treatment  plan with the patient. The patient was provided an opportunity to ask questions and all were answered. The patient agreed with the plan and demonstrated an understanding of the instructions.   I spent 13 minutes on the date of this visit in the care of this patient. See summary of tasks completed to properly care for this patient in the detailed notes above which also included counseling of above, review of PMH, medications, allergies, evaluation of the patient and ordering and/or  instructing patient on testing and care options.     Terressa Koyanagi, DO

## 2021-02-04 NOTE — Patient Instructions (Addendum)
  HOME CARE TIPS:  -I sent the medication(s) we discussed to your pharmacy: Meds ordered this encounter  Medications   nirmatrelvir/ritonavir EUA (PAXLOVID) 20 x 150 MG & 10 x 100MG  TABS    Sig: Take 3 tablets by mouth 2 (two) times daily for 5 days. (Take nirmatrelvir 150 mg two tablets twice daily for 5 days and ritonavir 100 mg one tablet twice daily for 5 days) Patient GFR is 83 in July    Dispense:  30 tablet    Refill:  0     -I sent in the Covid19 treatment or referral you requested per our discussion. Please see the information provided below and discuss further with the pharmacist/treatment team.  -If taking Paxlovid, please review all medications, supplement and over the counter drugs with your pharmacist and ask them to check for any interactions.   -can use nasal saline a few times per day if you have nasal congestion  -stay hydrated, drink plenty of fluids and eat small healthy meals - avoid dairy  -follow up with your doctor in 2-3 days unless improving and feeling better  -stay home while sick, except to seek medical care. If you have COVID19, ideally it would be best to stay home for a full 10 days since the onset of symptoms PLUS one day of no fever and feeling better. Wear a good mask that fits snugly (such as N95 or KN95) if around others to reduce the risk of transmission.  It was nice to meet you today, and I really hope you are feeling better soon. I help [facility] out with telemedicine visits on Tuesdays and Thursdays and am available for visits on those days. If you have any concerns or questions following this visit please schedule a follow up visit with your Primary Care doctor or seek care at a local urgent care clinic to avoid delays in care.    Seek in person care or schedule a follow up video visit promptly if your symptoms worsen, new concerns arise or you are not improving with treatment. Call 911 and/or seek emergency care if your symptoms are severe or  life threatening.

## 2021-03-06 ENCOUNTER — Telehealth: Payer: Self-pay | Admitting: Family Medicine

## 2021-03-06 DIAGNOSIS — F319 Bipolar disorder, unspecified: Secondary | ICD-10-CM

## 2021-03-06 NOTE — Telephone Encounter (Signed)
Pt sister call and stated pt need a refill on lithium 600 MG capsule sent to  [id number] - [place], [place] - [address] AT [place] Phone:  [phone]  Fax:  [phone]

## 2021-03-07 NOTE — Telephone Encounter (Signed)
Last Ov 11/06/20  Is it ok to refill?

## 2021-03-10 MED ORDER — LITHIUM CARBONATE 600 MG PO CAPS
600.0000 mg | ORAL_CAPSULE | Freq: Every day | ORAL | 1 refills | Status: DC
Start: 2021-03-10 — End: 2021-09-04

## 2021-03-10 MED ORDER — LITHIUM CARBONATE 600 MG PO CAPS
600.0000 mg | ORAL_CAPSULE | Freq: Every day | ORAL | 1 refills | Status: DC
Start: 2021-03-10 — End: 2021-03-10

## 2021-03-10 NOTE — Addendum Note (Signed)
Addended by: Solon Augusta on: 03/10/2021 01:52 PM   Modules accepted: Orders

## 2021-03-10 NOTE — Telephone Encounter (Signed)
Rx sent in. Patient is aware.  

## 2021-07-10 ENCOUNTER — Encounter: Payer: Self-pay | Admitting: Family Medicine

## 2021-07-10 ENCOUNTER — Telehealth: Payer: Self-pay | Admitting: Family Medicine

## 2021-07-10 NOTE — Telephone Encounter (Signed)
Pt's mother states her son has various mental issues that will prevent him from attending jury for this August 07, 2021 summons or any in the future. States he is on medication that makes him very groggy. States the letter should be detailed with why his mental condition would disable him from attending jury duty. Juror [id number]. Mother can be contacted if there are any questions and asks that someone calls her when the letter is complete.  ?

## 2021-07-11 NOTE — Telephone Encounter (Signed)
Pt's mother informed that letter has been completed and requested letter be placed in our front office for pickup. Letter placed in filing cabinet. ?

## 2021-07-12 IMAGING — NM NM DATSCAN
2 series · 24 of 30 positions shown · non-contrast
Comparison: None.

CLINICAL DATA: 64-year-old male with shuffling gait. Decreased arm
swing. Patient on lithium treatment of bipolar disorder.

EXAM:
NUCLEAR MEDICINE BRAIN IMAGING WITH SPECT  (DaTscan )
TECHNIQUE: SPECT images of the brain were obtained after intravenous injection
of radiopharmaceutical. 4 hour post injection imaging. Appropriate
positioning. 130 mg i-STAT given orally for thyroid blockade.
RADIOPHARMACEUTICALS:  5.0 millicuries I 123 Ioflupane

[Series 1: dat scan · 4.14mm/px · 5 of 120 frames shown]
[frame 11/120  full-range]
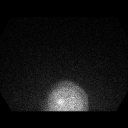
[frame 31/120  full-range]
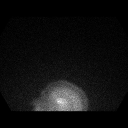
[frame 71/120  full-range]
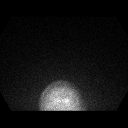
[frame 91/120  full-range]
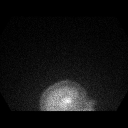
[frame 111/120  full-range]
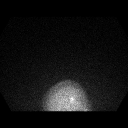

[Series 1013: mpr (id) range · 0.64mm/px · 19 of 31 slices shown]
[im 1/31]
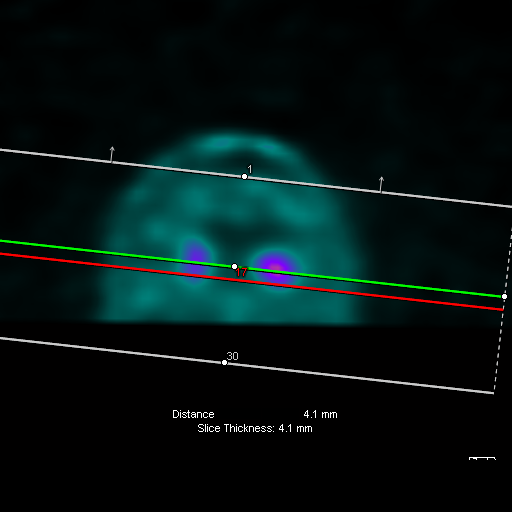
[im 3/31]
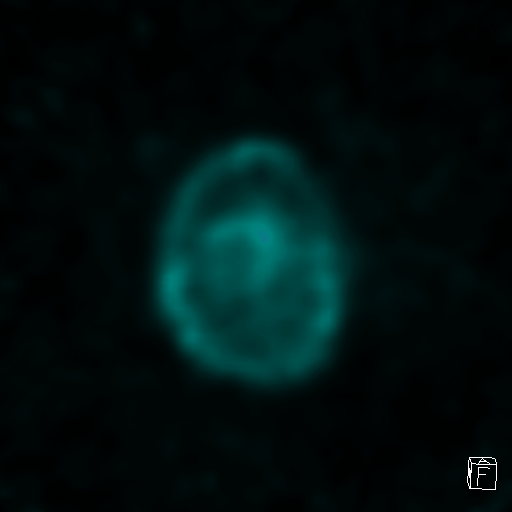
[im 4/31]
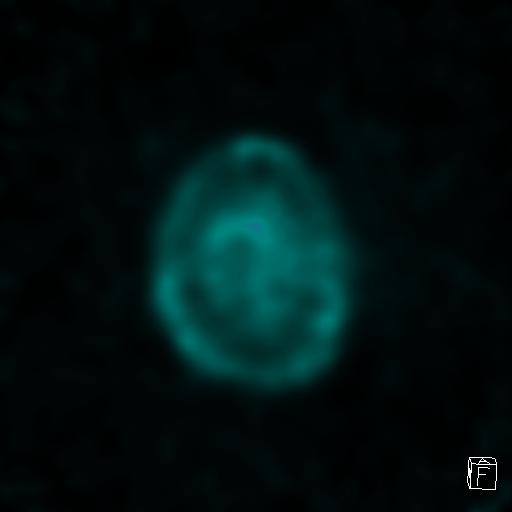
[im 6/31]
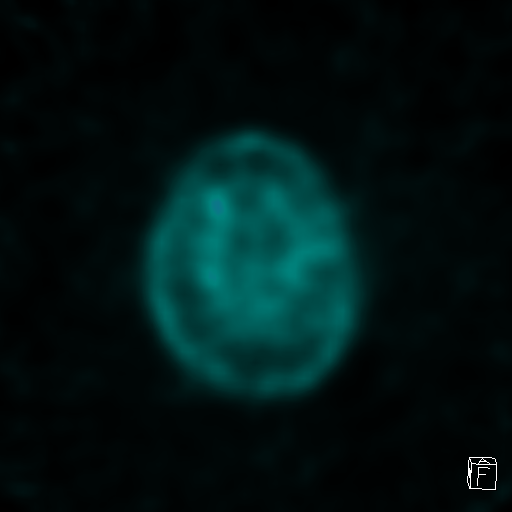
[im 7/31]
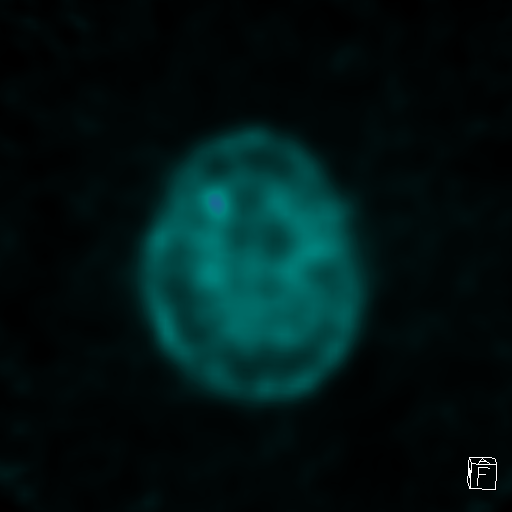
[im 10/31]
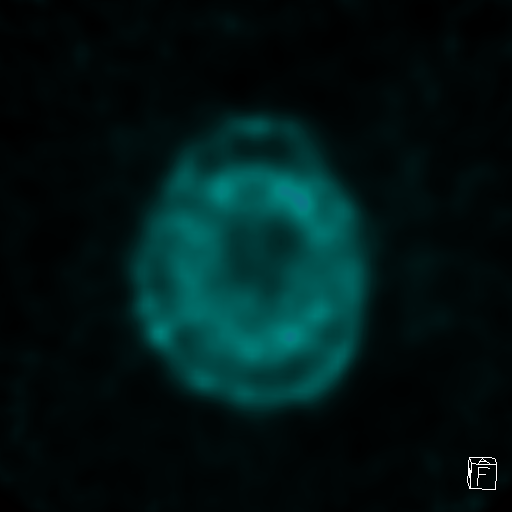
[im 11/31]
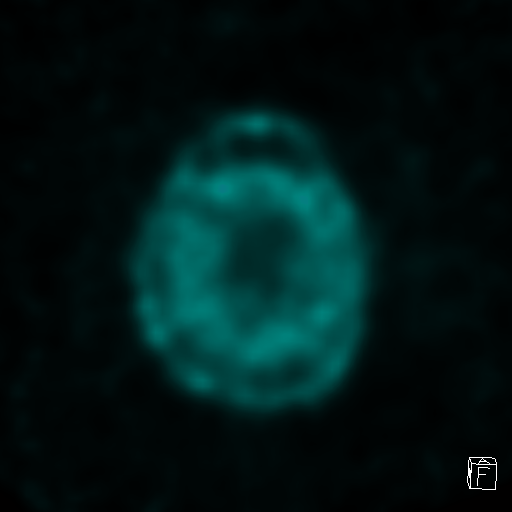
[im 12/31]
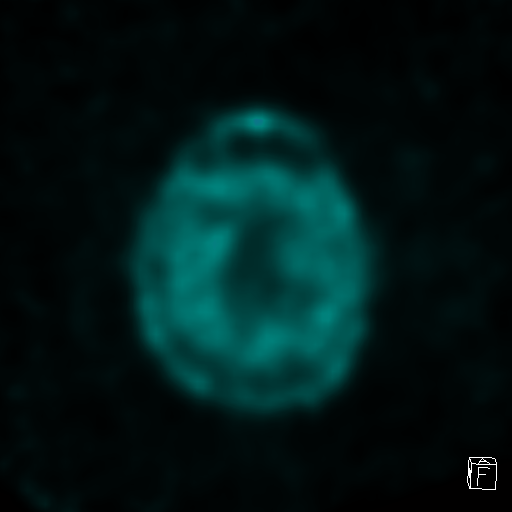
[im 14/31]
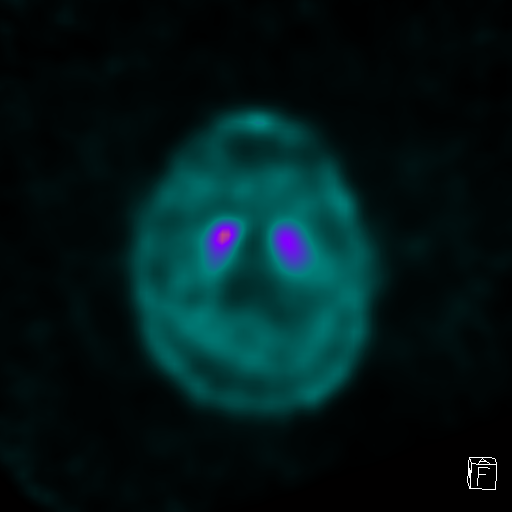
[im 16/31]
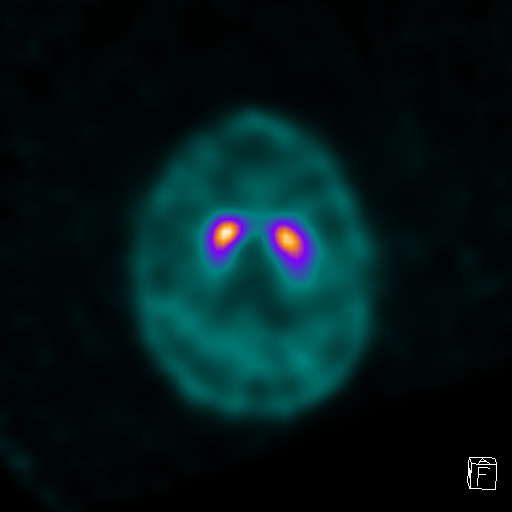
[im 17/31]
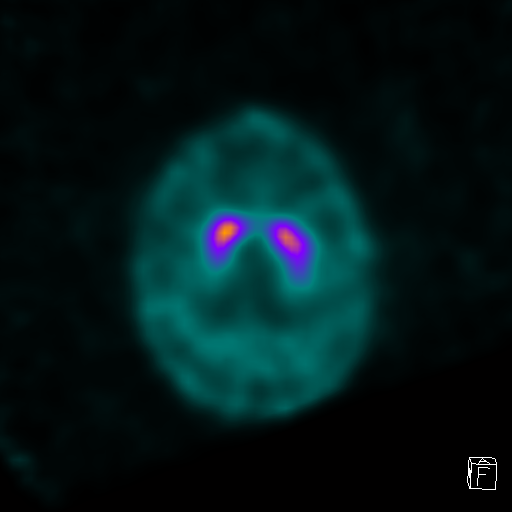
[im 19/31]
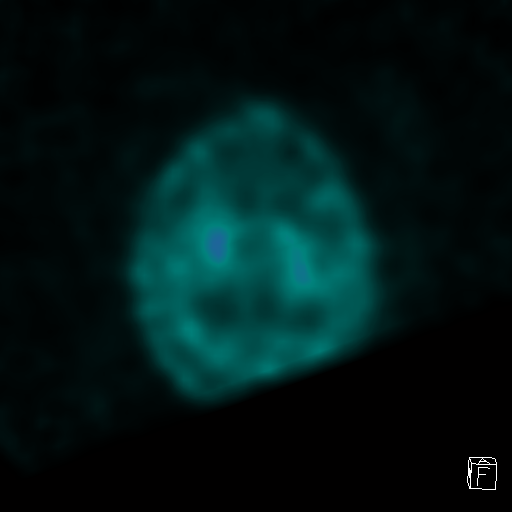
[im 20/31]
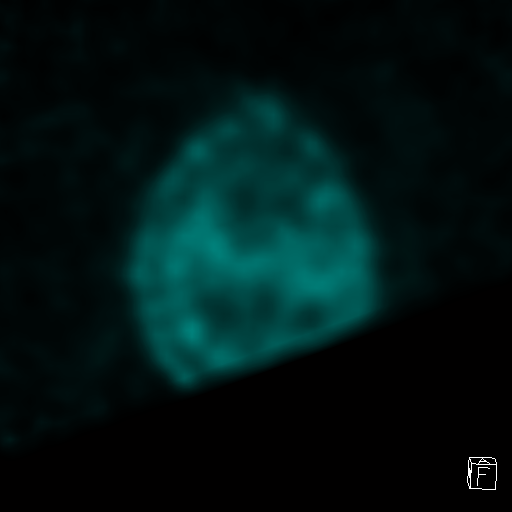
[im 23/31]
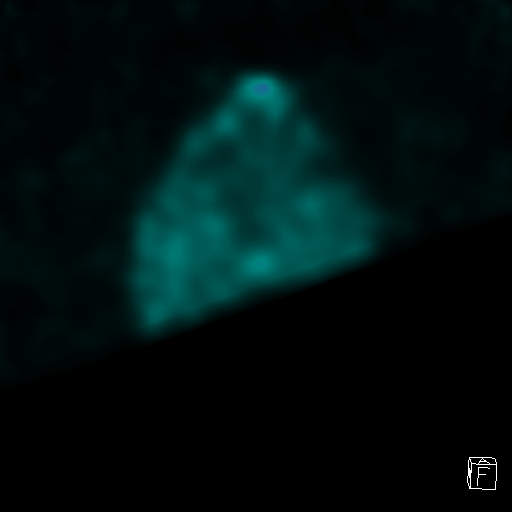
[im 24/31]
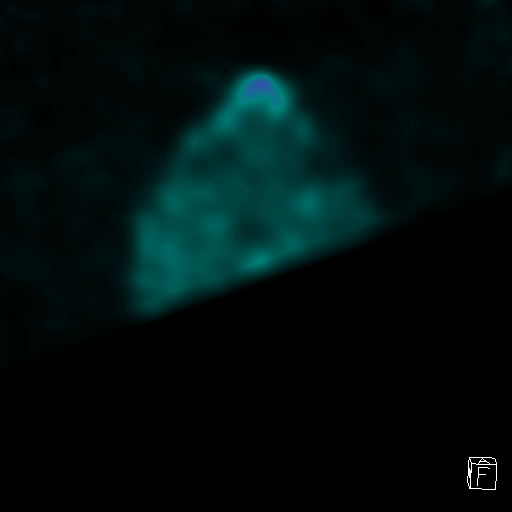
[im 25/31]
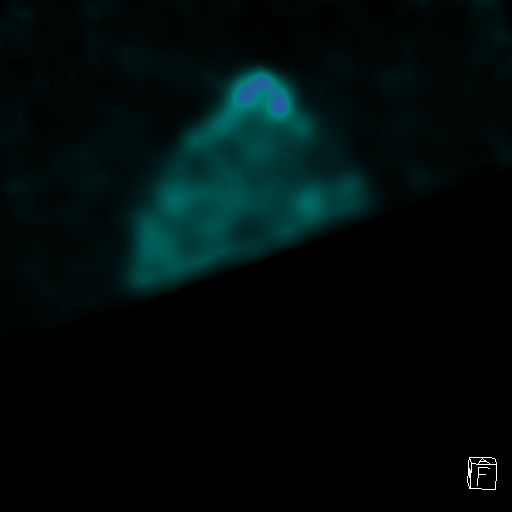
[im 27/31]
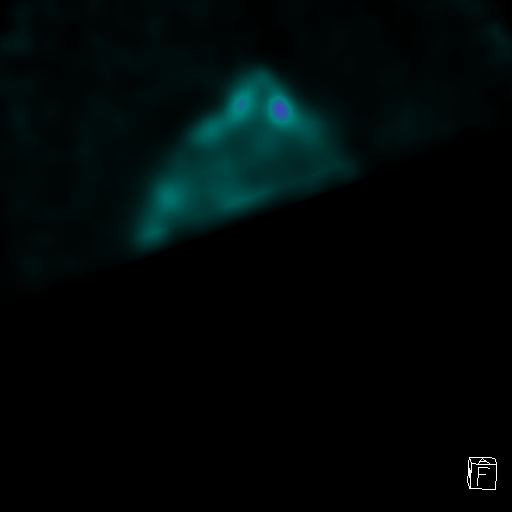
[im 29/31]
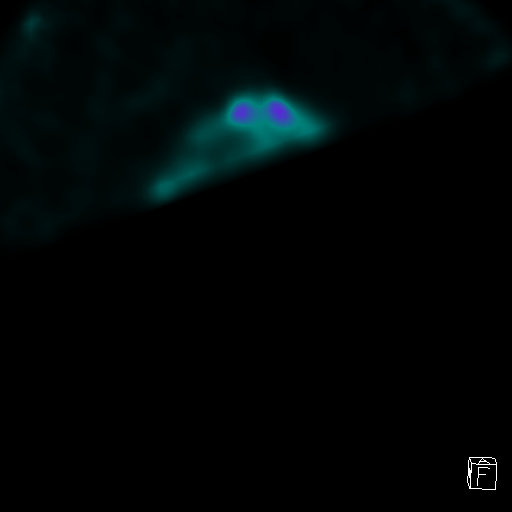
[im 31/31]
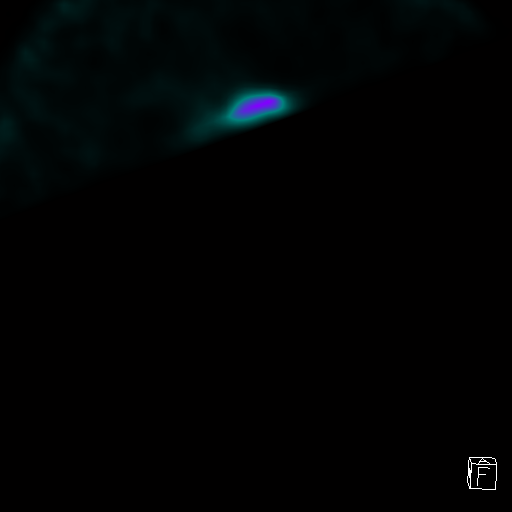

[24 of 30 positions shown; findings below may reference images not displayed]

FINDINGS: There is mild truncation the posterior striatum on the LEFT and
RIGHT with slightly greater loss activity on the most posterior
RIGHT putamen. The anterior putamen and heads of caudate nuclei are
normal. Activity fairly symmetric side-to-side.
IMPRESSION: Equivocal exam with mild truncation of the most posterior putamen on
LEFT and RIGHT. Findings could indicate early Parkinsonian syndrome
pathology. Consider repeat [HOSPITAL] some future point.

Of note, DaTSCAN is not diagnostic of Parkinsonian syndromes, which
remains a clinical diagnosis. DaTscan is an adjuvant test to aid in
the clinical diagnosis of Parkinsonian syndromes.

## 2021-07-15 ENCOUNTER — Ambulatory Visit (INDEPENDENT_AMBULATORY_CARE_PROVIDER_SITE_OTHER): Payer: Medicare Other

## 2021-07-15 VITALS — Ht 75.0 in | Wt 269.0 lb

## 2021-07-15 DIAGNOSIS — Z0000 Encounter for general adult medical examination without abnormal findings: Secondary | ICD-10-CM

## 2021-07-15 NOTE — Progress Notes (Signed)
? ?Subjective:  ? William Frye is a 66 y.o. male who presents for Medicare Annual/Subsequent preventive examination. ? ?Review of Systems    ?Virtual Visit via Telephone Note ? ?I connected with  William Frye on 07/15/21 at  8:15 AM EDT by telephone and verified that I am speaking with the correct person using two identifiers. ? ?Location: ?Patient: Home ?Provider: Office ?Persons participating in the virtual visit: patient/Nurse Health Advisor ?  ?I discussed the limitations, risks, security and privacy concerns of performing an evaluation and management service by telephone and the availability of in person appointments. The patient expressed understanding and agreed to proceed. ? ?Interactive audio and video telecommunications were attempted between this nurse and patient, however failed, due to patient having technical difficulties OR patient did not have access to video capability.  We continued and completed visit with audio only. ? ?Some vital signs may be absent or patient reported.  ? ?[name], LPN  ?Cardiac Risk Factors include: advanced age (>63men, >65 women);male gender ? ?   ?Objective:  ?  ?Today's Vitals  ? 07/15/21 0818  ?Weight: 269 lb (122 kg)  ?Height: 6\' 3"  (1.905 m)  ? ?Body mass index is 33.62 kg/m?. ? ? ?  07/15/2021  ?  8:25 AM 01/04/2019  ? 11:29 PM 01/04/2019  ?  5:23 PM  ?Advanced Directives  ?Does Patient Have a Medical Advance Directive? Yes No No  ?Type of 13/06/2018 of [place];Living will    ?Does patient want to make changes to medical advance directive? No - Patient declined    ?Copy of Healthcare Power of Attorney in Chart? No - copy requested    ?Would patient like information on creating a medical advance directive?  No - Patient declined No - Patient declined  ? ? ?Current Medications (verified) ?Outpatient Encounter Medications as of 07/15/2021  ?Medication Sig  ? lithium 600 MG capsule Take 1 capsule (600 mg total) by mouth daily.  ? Multiple  Vitamins-Minerals (MENS MULTIVITAMIN PLUS PO) Take by mouth.  ? Omega-3 Fatty Acids (FISH OIL) 1000 MG CAPS Take by mouth.  ? Saccharomyces boulardii (PROBIOTIC) 250 MG CAPS Take by mouth.   ? ?No facility-administered encounter medications on file as of 07/15/2021.  ? ? ?Allergies (verified) ?Lithium, Seroquel [quetiapine fumarate], and Aripiprazole  ? ?History: ?Past Medical History:  ?Diagnosis Date  ? Depression   ? Emphysema with chronic bronchitis (HCC)   ? Frequent headaches   ? Hallucinations   ? ?History reviewed. No pertinent surgical history. ?Family History  ?Problem Relation Age of Onset  ? Hyperlipidemia Mother   ? COPD Father   ? Cancer Father   ? Depression Brother   ? Drug abuse Brother   ? Early death Brother   ? Mental illness Brother   ? Depression Sister   ? Seizures Neg Hx   ? Parkinson's disease Neg Hx   ? Dementia Neg Hx   ? ?Social History  ? ?Socioeconomic History  ? Marital status: Divorced  ?  Spouse name: Not on file  ? Number of children: Not on file  ? Years of education: Not on file  ? Highest education level: High school graduate  ?Occupational History  ? Not on file  ?Tobacco Use  ? Smoking status: Former  ?  Years: 30.00  ?  Types: Cigarettes  ?  Quit date: 03/2018  ?  Years since quitting: 3.3  ? Smokeless tobacco: Never  ?Vaping Use  ?  Vaping Use: Never used  ?Substance and Sexual Activity  ? Alcohol use: Not Currently  ? Drug use: Never  ? Sexual activity: Not Currently  ?  Partners: Female  ?Other Topics Concern  ? Not on file  ?Social History Narrative  ? Lives with sister  ? Right handed  ? Caffeine: 1-2 cups/day  ? ?Social Determinants of Health  ? ?Financial Resource Strain: Low Risk   ? Difficulty of Paying Living Expenses: Not hard at all  ?Food Insecurity: No Food Insecurity  ? Worried About Programme researcher, broadcasting/film/video in the Last Year: Never true  ? Ran Out of Food in the Last Year: Never true  ?Transportation Needs: No Transportation Needs  ? Lack of Transportation (Medical):  No  ? Lack of Transportation (Non-Medical): No  ?Physical Activity: Inactive  ? Days of Exercise per Week: 0 days  ? Minutes of Exercise per Session: 0 min  ?Stress: No Stress Concern Present  ? Feeling of Stress : Not at all  ?Social Connections: Moderately Integrated  ? Frequency of Communication with Friends and Family: More than three times a week  ? Frequency of Social Gatherings with Friends and Family: More than three times a week  ? Attends Religious Services: 1 to 4 times per year  ? Active Member of Clubs or Organizations: No  ? Attends Banker Meetings: 1 to 4 times per year  ? Marital Status: Divorced  ? ? ? ?Clinical Intake: ?How often do you need to have someone help you when you read instructions, pamphlets, or other written materials from your doctor or pharmacy?: 1 - Never ? ?Diabetic? No ? ?Interpreter Needed?: NoActivities of Daily Living ? ?  07/15/2021  ?  8:24 AM  ?In your present state of health, do you have any difficulty performing the following activities:  ?Hearing? 0  ?Vision? 0  ?Difficulty concentrating or making decisions? 0  ?Walking or climbing stairs? 0  ?Dressing or bathing? 0  ?Doing errands, shopping? 0  ?Preparing Food and eating ? N  ?Using the Toilet? N  ?In the past six months, have you accidently leaked urine? N  ?Do you have problems with loss of bowel control? N  ?Managing your Medications? N  ?Managing your Finances? N  ?Housekeeping or managing your Housekeeping? N  ? ? ?Patient Care Team: ?Kristian Covey, MD as PCP - General (Family Medicine) ?Cottle, Steva Ready., MD as Attending Physician (Psychiatry) ? ?Indicate any recent Medical Services you may have received from other than [facility] providers in the past year (date may be approximate). ? ?   ?Assessment:  ? This is a routine wellness examination for William Frye. ? ?Hearing/Vision screen ?Hearing Screening - Comments:: No hearing difficulty ?Vision Screening - Comments:: Wears glasses. ? ?Dietary issues and  exercise activities discussed: ?Exercise limited by: None identified ? ? Goals Addressed   ? ?  ?  ?  ?  ?  ? This Visit's Progress  ?   Keep living (pt-stated)     ? ?  ? ?Depression Screen ? ?  07/15/2021  ?  8:22 AM 02/15/2018  ? 12:04 PM  ?PHQ 2/9 Scores  ?PHQ - 2 Score 0 6  ?PHQ- 9 Score  27  ?  ?Fall Risk ? ?  07/15/2021  ?  8:24 AM  ?Fall Risk   ?Falls in the past year? 0  ?Number falls in past yr: 0  ?Injury with Fall? 0  ?Risk for fall due to :  No Fall Risks  ? ? ?FALL RISK PREVENTION PERTAINING TO THE HOME: ? ?Any stairs in or around the home? No  ?If so, are there any without handrails? No  ?Home free of loose throw rugs in walkways, pet beds, electrical cords, etc? Yes  ?Adequate lighting in your home to reduce risk of falls? Yes  ? ?ASSISTIVE DEVICES UTILIZED TO PREVENT FALLS: ? ?Life alert? No  ?Use of a cane, walker or w/c? No  ?Grab bars in the bathroom? No  ?Shower chair or bench in shower? No  ?Elevated toilet seat or a handicapped toilet? No  ? ?TIMED UP AND GO: ? ?Was the test performed? No . Audio Visit ? ?Cognitive Function: ? ?  12/28/2019  ?  9:00 AM 11/02/2019  ? 10:28 AM  ?MMSE - Mini Mental State Exam  ?Orientation to time 4 1  ?Orientation to Place 4 2  ?Registration 3 3  ?Attention/ Calculation 3 3  ?Recall 3 3  ?Language- name 2 objects 2 2  ?Language- repeat 1 1  ?Language- follow 3 step command 2 2  ?Language- read & follow direction 1 0  ?Write a sentence 1 1  ?Copy design 1 1  ?Total score 25 19  ? ?  ? ?  07/15/2021  ?  8:26 AM  ?6CIT Screen  ?What Year? 0 points  ?What month? 0 points  ?What time? 0 points  ?Count back from 20 0 points  ?Months in reverse 0 points  ?Repeat phrase 0 points  ?Total Score 0 points  ? ? ?Immunizations ? ?There is no immunization history on file for this patient. ? ?TDAP status: Due, Education has been provided regarding the importance of this vaccine. Advised may receive this vaccine at local pharmacy or Health Dept. Aware to provide a copy of the  vaccination record if obtained from local pharmacy or Health Dept. Verbalized acceptance and understanding. ? ?Flu Vaccine status: Declined, Education has been provided regarding the importance of this vaccine

## 2021-07-15 NOTE — Patient Instructions (Addendum)
?William Frye , ?Thank you for taking time to come for your Medicare Wellness Visit. I appreciate your ongoing commitment to your health goals. Please review the following plan we discussed and let me know if I can assist you in the future.  ? ?These are the goals we discussed: ? Goals   ? ?   Keep living (pt-stated)   ? ?  ?  ?This is a list of the screening recommended for you and due dates:  ?Health Maintenance  ?Topic Date Due  ? COVID-19 Vaccine (1) 07/31/2021*  ? Zoster (Shingles) Vaccine (1 of 2) 10/15/2021*  ? Colon Cancer Screening  07/16/2022*  ? Tetanus Vaccine  07/16/2022*  ? Hepatitis C Screening: USPSTF Recommendation to screen - Ages 18-79 yo.  07/16/2022*  ? Flu Shot  09/30/2021  ? HIV Screening  Completed  ? HPV Vaccine  Aged Out  ?*Topic was postponed. The date shown is not the original due date.  ?  ?Advanced directives: Yes  ? ?Conditions/risks identified: None ? ?Next appointment: Follow up in one year for your annual wellness visit.  ? ?Preventive Care 27 Years and Older, Male ?Preventive care refers to lifestyle choices and visits with your health care provider that can promote health and wellness. ?What does preventive care include? ?A yearly physical exam. This is also called an annual well check. ?Dental exams once or twice a year. ?Routine eye exams. Ask your health care provider how often you should have your eyes checked. ?Personal lifestyle choices, including: ?Daily care of your teeth and gums. ?Regular physical activity. ?Eating a healthy diet. ?Avoiding tobacco and drug use. ?Limiting alcohol use. ?Practicing safe sex. ?Taking low doses of aspirin every day. ?Taking vitamin and mineral supplements as recommended by your health care provider. ?What happens during an annual well check? ?The services and screenings done by your health care provider during your annual well check will depend on your age, overall health, lifestyle risk factors, and family history of disease. ?Counseling   ?Your health care provider may ask you questions about your: ?Alcohol use. ?Tobacco use. ?Drug use. ?Emotional well-being. ?Home and relationship well-being. ?Sexual activity. ?Eating habits. ?History of falls. ?Memory and ability to understand (cognition). ?Work and work Statistician. ?Screening  ?You may have the following tests or measurements: ?Height, weight, and BMI. ?Blood pressure. ?Lipid and cholesterol levels. These may be checked every 5 years, or more frequently if you are over 62 years old. ?Skin check. ?Lung cancer screening. You may have this screening every year starting at age 24 if you have a 30-pack-year history of smoking and currently smoke or have quit within the past 15 years. ?Fecal occult blood test (FOBT) of the stool. You may have this test every year starting at age 26. ?Flexible sigmoidoscopy or colonoscopy. You may have a sigmoidoscopy every 5 years or a colonoscopy every 10 years starting at age 39. ?Prostate cancer screening. Recommendations will vary depending on your family history and other risks. ?Hepatitis C blood test. ?Hepatitis B blood test. ?Sexually transmitted disease (STD) testing. ?Diabetes screening. This is done by checking your blood sugar (glucose) after you have not eaten for a while (fasting). You may have this done every 1-3 years. ?Abdominal aortic aneurysm (AAA) screening. You may need this if you are a current or former smoker. ?Osteoporosis. You may be screened starting at age 34 if you are at high risk. ?Talk with your health care provider about your test results, treatment options, and if necessary, the need  for more tests. ?Vaccines  ?Your health care provider may recommend certain vaccines, such as: ?Influenza vaccine. This is recommended every year. ?Tetanus, diphtheria, and acellular pertussis (Tdap, Td) vaccine. You may need a Td booster every 10 years. ?Zoster vaccine. You may need this after age 29. ?Pneumococcal 13-valent conjugate (PCV13) vaccine.  One dose is recommended after age 8. ?Pneumococcal polysaccharide (PPSV23) vaccine. One dose is recommended after age 1. ?Talk to your health care provider about which screenings and vaccines you need and how often you need them. ?This information is not intended to replace advice given to you by your health care provider. Make sure you discuss any questions you have with your health care provider. ?Document Released: 03/15/2015 Document Revised: 11/06/2015 Document Reviewed: 12/18/2014 ?Elsevier Interactive Patient Education ? 2017 [employer]. ? ?Fall Prevention in the Home ?Falls can cause injuries. They can happen to people of all ages. There are many things you can do to make your home safe and to help prevent falls. ?What can I do on the outside of my home? ?Regularly fix the edges of walkways and driveways and fix any cracks. ?Remove anything that might make you trip as you walk through a door, such as a raised step or threshold. ?Trim any bushes or trees on the path to your home. ?Use bright outdoor lighting. ?Clear any walking paths of anything that might make someone trip, such as rocks or tools. ?Regularly check to see if handrails are loose or broken. Make sure that both sides of any steps have handrails. ?Any raised decks and porches should have guardrails on the edges. ?Have any leaves, snow, or ice cleared regularly. ?Use sand or salt on walking paths during winter. ?Clean up any spills in your garage right away. This includes oil or grease spills. ?What can I do in the bathroom? ?Use night lights. ?Install grab bars by the toilet and in the tub and shower. Do not use towel bars as grab bars. ?Use non-skid mats or decals in the tub or shower. ?If you need to sit down in the shower, use a plastic, non-slip stool. ?Keep the floor dry. Clean up any water that spills on the floor as soon as it happens. ?Remove soap buildup in the tub or shower regularly. ?Attach bath mats securely with double-sided  non-slip rug tape. ?Do not have throw rugs and other things on the floor that can make you trip. ?What can I do in the bedroom? ?Use night lights. ?Make sure that you have a light by your bed that is easy to reach. ?Do not use any sheets or blankets that are too big for your bed. They should not hang down onto the floor. ?Have a firm chair that has side arms. You can use this for support while you get dressed. ?Do not have throw rugs and other things on the floor that can make you trip. ?What can I do in the kitchen? ?Clean up any spills right away. ?Avoid walking on wet floors. ?Keep items that you use a lot in easy-to-reach places. ?If you need to reach something above you, use a strong step stool that has a grab bar. ?Keep electrical cords out of the way. ?Do not use floor polish or wax that makes floors slippery. If you must use wax, use non-skid floor wax. ?Do not have throw rugs and other things on the floor that can make you trip. ?What can I do with my stairs? ?Do not leave any items on the stairs. ?  Make sure that there are handrails on both sides of the stairs and use them. Fix handrails that are broken or loose. Make sure that handrails are as long as the stairways. ?Check any carpeting to make sure that it is firmly attached to the stairs. Fix any carpet that is loose or worn. ?Avoid having throw rugs at the top or bottom of the stairs. If you do have throw rugs, attach them to the floor with carpet tape. ?Make sure that you have a light switch at the top of the stairs and the bottom of the stairs. If you do not have them, ask someone to add them for you. ?What else can I do to help prevent falls? ?Wear shoes that: ?Do not have high heels. ?Have rubber bottoms. ?Are comfortable and fit you well. ?Are closed at the toe. Do not wear sandals. ?If you use a stepladder: ?Make sure that it is fully opened. Do not climb a closed stepladder. ?Make sure that both sides of the stepladder are locked into place. ?Ask  someone to hold it for you, if possible. ?Clearly mark and make sure that you can see: ?Any grab bars or handrails. ?First and last steps. ?Where the edge of each step is. ?Use tools that help you move aroun

## 2021-09-04 ENCOUNTER — Other Ambulatory Visit: Payer: Self-pay | Admitting: Family Medicine

## 2021-09-04 DIAGNOSIS — F319 Bipolar disorder, unspecified: Secondary | ICD-10-CM

## 2021-09-08 ENCOUNTER — Telehealth: Payer: Self-pay | Admitting: Family Medicine

## 2021-09-08 NOTE — Telephone Encounter (Signed)
Pt's mother called to request a refill of the:  lithium 600 MG capsule  Pt's mother states he is about to run out.  Please send to: [id number] - [place], [place] - [address] AT [facility] [address] Phone:  [phone]  Fax:  [phone]

## 2021-09-08 NOTE — Telephone Encounter (Signed)
Pt's mother informed that rx has been sent

## 2021-09-09 ENCOUNTER — Other Ambulatory Visit: Payer: Self-pay | Admitting: Family Medicine

## 2021-09-09 DIAGNOSIS — F319 Bipolar disorder, unspecified: Secondary | ICD-10-CM

## 2021-09-22 ENCOUNTER — Encounter: Payer: Self-pay | Admitting: Family Medicine

## 2021-11-28 ENCOUNTER — Other Ambulatory Visit: Payer: Self-pay | Admitting: Family Medicine

## 2021-11-28 DIAGNOSIS — F319 Bipolar disorder, unspecified: Secondary | ICD-10-CM

## 2021-12-09 ENCOUNTER — Other Ambulatory Visit: Payer: Self-pay | Admitting: Family Medicine

## 2021-12-09 DIAGNOSIS — F319 Bipolar disorder, unspecified: Secondary | ICD-10-CM

## 2021-12-16 ENCOUNTER — Ambulatory Visit (INDEPENDENT_AMBULATORY_CARE_PROVIDER_SITE_OTHER): Payer: Medicare Other | Admitting: Family Medicine

## 2021-12-16 VITALS — BP 120/80 | HR 100 | Temp 98.2°F | Ht 75.0 in | Wt 271.2 lb

## 2021-12-16 DIAGNOSIS — R739 Hyperglycemia, unspecified: Secondary | ICD-10-CM

## 2021-12-16 DIAGNOSIS — F319 Bipolar disorder, unspecified: Secondary | ICD-10-CM

## 2021-12-16 DIAGNOSIS — Z79899 Other long term (current) drug therapy: Secondary | ICD-10-CM | POA: Diagnosis not present

## 2021-12-16 LAB — CBC WITH DIFFERENTIAL/PLATELET
Basophils Absolute: 0.1 10*3/uL (ref 0.0–0.1)
Basophils Relative: 1 % (ref 0.0–3.0)
Eosinophils Absolute: 0.1 10*3/uL (ref 0.0–0.7)
Eosinophils Relative: 0.8 % (ref 0.0–5.0)
HCT: 50.3 % (ref 39.0–52.0)
Hemoglobin: 16.7 g/dL (ref 13.0–17.0)
Lymphocytes Relative: 19.2 % (ref 12.0–46.0)
Lymphs Abs: 1.3 10*3/uL (ref 0.7–4.0)
MCHC: 33.2 g/dL (ref 30.0–36.0)
MCV: 92.8 fl (ref 78.0–100.0)
Monocytes Absolute: 0.5 10*3/uL (ref 0.1–1.0)
Monocytes Relative: 7 % (ref 3.0–12.0)
Neutro Abs: 4.9 10*3/uL (ref 1.4–7.7)
Neutrophils Relative %: 72 % (ref 43.0–77.0)
Platelets: 258 10*3/uL (ref 150.0–400.0)
RBC: 5.42 Mil/uL (ref 4.22–5.81)
RDW: 14 % (ref 11.5–15.5)
WBC: 6.9 10*3/uL (ref 4.0–10.5)

## 2021-12-16 LAB — COMPREHENSIVE METABOLIC PANEL
ALT: 21 U/L (ref 0–53)
AST: 19 U/L (ref 0–37)
Albumin: 4.5 g/dL (ref 3.5–5.2)
Alkaline Phosphatase: 118 U/L — ABNORMAL HIGH (ref 39–117)
BUN: 16 mg/dL (ref 6–23)
CO2: 23 mEq/L (ref 19–32)
Calcium: 9.7 mg/dL (ref 8.4–10.5)
Chloride: 105 mEq/L (ref 96–112)
Creatinine, Ser: 1.12 mg/dL (ref 0.40–1.50)
GFR: 69.41 mL/min (ref >60.00)
Glucose, Bld: 152 mg/dL — ABNORMAL HIGH (ref 70–99)
Potassium: 4 mEq/L (ref 3.5–5.1)
Sodium: 139 mEq/L (ref 135–145)
Total Bilirubin: 0.7 mg/dL (ref 0.2–1.2)
Total Protein: 7.6 g/dL (ref 6.0–8.3)

## 2021-12-16 LAB — TSH: TSH: 2.26 u[IU]/mL (ref 0.35–5.50)

## 2021-12-16 LAB — HEMOGLOBIN A1C: Hgb A1c MFr Bld: 5.7 % (ref 4.6–6.5)

## 2021-12-16 MED ORDER — LITHIUM CARBONATE 600 MG PO CAPS: 600.0000 mg | ORAL_CAPSULE | Freq: Every day | ORAL | 3 refills | Status: DC

## 2021-12-16 NOTE — Progress Notes (Signed)
Established Patient Office Visit  Subjective   Patient ID: William Frye, male    DOB: January 17, 1956  Age: 66 y.o. MRN: [id number]  Chief Complaint  Patient presents with   Medication Consultation    HPI   William Frye has history of bipolar 1 disorder and prediabetes.  Accompanied by his sister who helps care for him.  He does still take lithium 600 mg once daily.  Bipolar stable on lithium.  Needs follow-up labs.  He has scaled back soda intake since last year but has gained about 10 pounds.  No polyuria or polydipsia.  Last A1c was 5.3%.  No other specific complaints today.  Declines flu vaccine.  We discussed other health maintenance screening such as PSA and he declines.  Past Medical History:  Diagnosis Date   Depression    Emphysema with chronic bronchitis (HCC)    Frequent headaches    Hallucinations    No past surgical history on file.  reports that he quit smoking about 3 years ago. His smoking use included cigarettes. He has never used smokeless tobacco. He reports that he does not currently use alcohol. He reports that he does not use drugs. family history includes COPD in his father; Cancer in his father; Depression in his brother and sister; Drug abuse in his brother; Early death in his brother; Hyperlipidemia in his mother; Mental illness in his brother. Allergies  Allergen Reactions   Lithium Diarrhea and Nausea Only   Seroquel [Quetiapine Fumarate]     Psychosis   Aripiprazole Other (See Comments)    Auditory and visual hallucinations    Review of Systems  Constitutional:  Negative for malaise/fatigue and weight loss.  Eyes:  Negative for blurred vision.  Respiratory:  Negative for shortness of breath.   Cardiovascular:  Negative for chest pain.  Gastrointestinal:  Negative for abdominal pain.  Genitourinary:  Negative for dysuria.  Neurological:  Negative for dizziness, weakness and headaches.      Objective:     BP 120/80 (BP Location: Left Arm, Patient  Position: Sitting, Cuff Size: Large)   Pulse 100   Temp 98.2 F (36.8 C) (Oral)   Ht 6\' 3"  (1.905 m)   Wt 271 lb 3.2 oz (123 kg)   SpO2 97%   BMI 33.90 kg/m  BP Readings from Last 3 Encounters:  12/16/21 120/80  11/06/20 120/80  02/20/20 118/70   Wt Readings from Last 3 Encounters:  12/16/21 271 lb 3.2 oz (123 kg)  07/15/21 269 lb (122 kg)  02/04/21 260 lb (117.9 kg)      Physical Exam Vitals reviewed.  Constitutional:      Appearance: He is well-developed.  HENT:     Right Ear: External ear normal.     Left Ear: External ear normal.  Eyes:     Pupils: Pupils are equal, round, and reactive to light.  Neck:     Thyroid: No thyromegaly.  Cardiovascular:     Rate and Rhythm: Normal rate and regular rhythm.  Pulmonary:     Effort: Pulmonary effort is normal. No respiratory distress.     Breath sounds: Normal breath sounds. No wheezing or rales.  Musculoskeletal:     Cervical back: Neck supple.  Neurological:     Mental Status: He is alert and oriented to person, place, and time.      No results found for any visits on 12/16/21.    The 10-year ASCVD risk score (Arnett DK, et al., 2019) is: 11.1%  Assessment & Plan:   Problem List Items Addressed This Visit   None Visit Diagnoses     High risk medication use    -  Primary   Relevant Orders   Lithium level   CMP   TSH   CBC with Differential/Platelet   Hemoglobin A1c   EKG 12-Lead (Completed)   Bipolar I disorder (HCC)       Relevant Medications   lithium 600 MG capsule   Hyperglycemia       Relevant Orders   Hemoglobin A1c     -With his lithium therapy we decided to get EKG specially given his age.  This shows sinus rhythm.  No acute ST-T changes. -Needs further labs with lithium level, CMP, TSH -Check A1c with prior history of prediabetes range blood sugars. -Encouraged to scale back sugar and needs to lose some weight -Offered things like PSA screening and he declines  No follow-ups on  file.    Evelena Peat, MD

## 2021-12-17 ENCOUNTER — Telehealth: Payer: Self-pay | Admitting: Family Medicine

## 2021-12-17 LAB — LITHIUM LEVEL: Lithium Lvl: 0.4 mmol/L — ABNORMAL LOW (ref 0.6–1.2)

## 2021-12-17 NOTE — Telephone Encounter (Addendum)
Pt's mother William Frye called to ask if she can have a copy of this Pt's lab results?    I see a Release of Information for his sister, [name].  I do not see where Pt has given his mother rights to his info.  Please advise.

## 2021-12-19 NOTE — Telephone Encounter (Signed)
If the patient's mom is not listed to ROI  we can't give the information to her.

## 2021-12-19 NOTE — Telephone Encounter (Signed)
That is correct 

## 2021-12-26 ENCOUNTER — Other Ambulatory Visit: Payer: Self-pay | Admitting: Family Medicine

## 2021-12-26 DIAGNOSIS — F319 Bipolar disorder, unspecified: Secondary | ICD-10-CM

## 2021-12-26 MED ORDER — LITHIUM CARBONATE 600 MG PO CAPS: 600.0000 mg | ORAL_CAPSULE | Freq: Every day | ORAL | 3 refills | Status: DC

## 2021-12-26 NOTE — Telephone Encounter (Signed)
Requesting refill for lithium 600 MG capsule  be sent [facility]. Did not want it to go to [place], [place] Phone:  [phone]  Fax:  [phone]

## 2022-07-17 ENCOUNTER — Ambulatory Visit (INDEPENDENT_AMBULATORY_CARE_PROVIDER_SITE_OTHER): Payer: Medicare HMO

## 2022-07-17 VITALS — Ht 75.0 in | Wt 260.0 lb

## 2022-07-17 DIAGNOSIS — Z0000 Encounter for general adult medical examination without abnormal findings: Secondary | ICD-10-CM

## 2022-07-17 NOTE — Patient Instructions (Addendum)
William Frye , Thank you for taking time to come for your Medicare Wellness Visit. I appreciate your ongoing commitment to your health goals. Please review the following plan we discussed and let me know if I can assist you in the future.   These are the goals we discussed:  Goals       Keep living (pt-stated)      No current goals (pt-stated)        This is a list of the screening recommended for you and due dates:  Health Maintenance  Topic Date Due   DTaP/Tdap/Td vaccine (1 - Tdap) Never done   COVID-19 Vaccine (1) 08/02/2022*   Zoster (Shingles) Vaccine (1 of 2) 10/17/2022*   Screening for Lung Cancer  07/17/2023*   Pneumonia Vaccine (1 of 2 - PCV) 07/17/2023*   Colon Cancer Screening  07/17/2023*   Hepatitis C Screening: USPSTF Recommendation to screen - Ages 18-79 yo.  07/17/2023*   Flu Shot  10/01/2022   Medicare Annual Wellness Visit  07/17/2023   HIV Screening  Completed   HPV Vaccine  Aged Out  *Topic was postponed. The date shown is not the original due date.    Advanced directives: Please bring a copy of your health care power of attorney and living will to the office to be added to your chart at your convenience.   Conditions/risks identified: None  Next appointment: Follow up in one year for your annual wellness visit.    Preventive Care 13 Years and Older, Male  Preventive care refers to lifestyle choices and visits with your health care provider that can promote health and wellness. What does preventive care include? A yearly physical exam. This is also called an annual well check. Dental exams once or twice a year. Routine eye exams. Ask your health care provider how often you should have your eyes checked. Personal lifestyle choices, including: Daily care of your teeth and gums. Regular physical activity. Eating a healthy diet. Avoiding tobacco and drug use. Limiting alcohol use. Practicing safe sex. Taking low doses of aspirin every day. Taking  vitamin and mineral supplements as recommended by your health care provider. What happens during an annual well check? The services and screenings done by your health care provider during your annual well check will depend on your age, overall health, lifestyle risk factors, and family history of disease. Counseling  Your health care provider may ask you questions about your: Alcohol use. Tobacco use. Drug use. Emotional well-being. Home and relationship well-being. Sexual activity. Eating habits. History of falls. Memory and ability to understand (cognition). Work and work Astronomer. Screening  You may have the following tests or measurements: Height, weight, and BMI. Blood pressure. Lipid and cholesterol levels. These may be checked every 5 years, or more frequently if you are over 14 years old. Skin check. Lung cancer screening. You may have this screening every year starting at age 77 if you have a 30-pack-year history of smoking and currently smoke or have quit within the past 15 years. Fecal occult blood test (FOBT) of the stool. You may have this test every year starting at age 47. Flexible sigmoidoscopy or colonoscopy. You may have a sigmoidoscopy every 5 years or a colonoscopy every 10 years starting at age 44. Prostate cancer screening. Recommendations will vary depending on your family history and other risks. Hepatitis C blood test. Hepatitis B blood test. Sexually transmitted disease (STD) testing. Diabetes screening. This is done by checking your blood sugar (glucose) after  you have not eaten for a while (fasting). You may have this done every 1-3 years. Abdominal aortic aneurysm (AAA) screening. You may need this if you are a current or former smoker. Osteoporosis. You may be screened starting at age 47 if you are at high risk. Talk with your health care provider about your test results, treatment options, and if necessary, the need for more tests. Vaccines  Your  health care provider may recommend certain vaccines, such as: Influenza vaccine. This is recommended every year. Tetanus, diphtheria, and acellular pertussis (Tdap, Td) vaccine. You may need a Td booster every 10 years. Zoster vaccine. You may need this after age 5. Pneumococcal 13-valent conjugate (PCV13) vaccine. One dose is recommended after age 61. Pneumococcal polysaccharide (PPSV23) vaccine. One dose is recommended after age 4. Talk to your health care provider about which screenings and vaccines you need and how often you need them. This information is not intended to replace advice given to you by your health care provider. Make sure you discuss any questions you have with your health care provider. Document Released: 03/15/2015 Document Revised: 11/06/2015 Document Reviewed: 12/18/2014 Elsevier Interactive Patient Education  2017 [employer].  Fall Prevention in the Home Falls can cause injuries. They can happen to people of all ages. There are many things you can do to make your home safe and to help prevent falls. What can I do on the outside of my home? Regularly fix the edges of walkways and driveways and fix any cracks. Remove anything that might make you trip as you walk through a door, such as a raised step or threshold. Trim any bushes or trees on the path to your home. Use bright outdoor lighting. Clear any walking paths of anything that might make someone trip, such as rocks or tools. Regularly check to see if handrails are loose or broken. Make sure that both sides of any steps have handrails. Any raised decks and porches should have guardrails on the edges. Have any leaves, snow, or ice cleared regularly. Use sand or salt on walking paths during winter. Clean up any spills in your garage right away. This includes oil or grease spills. What can I do in the bathroom? Use night lights. Install grab bars by the toilet and in the tub and shower. Do not use towel bars as  grab bars. Use non-skid mats or decals in the tub or shower. If you need to sit down in the shower, use a plastic, non-slip stool. Keep the floor dry. Clean up any water that spills on the floor as soon as it happens. Remove soap buildup in the tub or shower regularly. Attach bath mats securely with double-sided non-slip rug tape. Do not have throw rugs and other things on the floor that can make you trip. What can I do in the bedroom? Use night lights. Make sure that you have a light by your bed that is easy to reach. Do not use any sheets or blankets that are too big for your bed. They should not hang down onto the floor. Have a firm chair that has side arms. You can use this for support while you get dressed. Do not have throw rugs and other things on the floor that can make you trip. What can I do in the kitchen? Clean up any spills right away. Avoid walking on wet floors. Keep items that you use a lot in easy-to-reach places. If you need to reach something above you, use a strong  step stool that has a grab bar. Keep electrical cords out of the way. Do not use floor polish or wax that makes floors slippery. If you must use wax, use non-skid floor wax. Do not have throw rugs and other things on the floor that can make you trip. What can I do with my stairs? Do not leave any items on the stairs. Make sure that there are handrails on both sides of the stairs and use them. Fix handrails that are broken or loose. Make sure that handrails are as long as the stairways. Check any carpeting to make sure that it is firmly attached to the stairs. Fix any carpet that is loose or worn. Avoid having throw rugs at the top or bottom of the stairs. If you do have throw rugs, attach them to the floor with carpet tape. Make sure that you have a light switch at the top of the stairs and the bottom of the stairs. If you do not have them, ask someone to add them for you. What else can I do to help prevent  falls? Wear shoes that: Do not have high heels. Have rubber bottoms. Are comfortable and fit you well. Are closed at the toe. Do not wear sandals. If you use a stepladder: Make sure that it is fully opened. Do not climb a closed stepladder. Make sure that both sides of the stepladder are locked into place. Ask someone to hold it for you, if possible. Clearly mark and make sure that you can see: Any grab bars or handrails. First and last steps. Where the edge of each step is. Use tools that help you move around (mobility aids) if they are needed. These include: Canes. Walkers. Scooters. Crutches. Turn on the lights when you go into a dark area. Replace any light bulbs as soon as they burn out. Set up your furniture so you have a clear path. Avoid moving your furniture around. If any of your floors are uneven, fix them. If there are any pets around you, be aware of where they are. Review your medicines with your doctor. Some medicines can make you feel dizzy. This can increase your chance of falling. Ask your doctor what other things that you can do to help prevent falls. This information is not intended to replace advice given to you by your health care provider. Make sure you discuss any questions you have with your health care provider. Document Released: 12/13/2008 Document Revised: 07/25/2015 Document Reviewed: 03/23/2014 Elsevier Interactive Patient Education  2017 [employer].

## 2022-07-17 NOTE — Progress Notes (Signed)
Subjective:   William Frye is a 67 y.o. male who presents for Medicare Annual/Subsequent preventive examination.  Review of Systems    Virtual Visit via Telephone Note  I connected with  William Frye on 07/20/22 at  8:15 AM EDT by telephone and verified that I am speaking with the correct person using two identifiers.  Location: Patient: Home Provider: Office Persons participating in the virtual visit: patient/Nurse Health Advisor   I discussed the limitations, risks, security and privacy concerns of performing an evaluation and management service by telephone and the availability of in person appointments. The patient expressed understanding and agreed to proceed.  Interactive audio and video telecommunications were attempted between this nurse and patient, however failed, due to patient having technical difficulties OR patient did not have access to video capability.  We continued and completed visit with audio only.  Some vital signs may be absent or patient reported.   [name], LPN  Cardiac Risk Factors include: advanced age (>51men, >78 women);male gender     Objective:    Today's Vitals   07/17/22 0818  Weight: 260 lb (117.9 kg)  Height: 6\' 3"  (1.905 m)   Body mass index is 32.5 kg/m.     07/17/2022    8:23 AM 07/15/2021    8:25 AM 01/04/2019   11:29 PM 01/04/2019    5:23 PM  Advanced Directives  Does Patient Have a Medical Advance Directive? Yes Yes No No  Type of Estate agent of [place];Living will Healthcare Power of [place];Living will    Does patient want to make changes to medical advance directive?  No - Patient declined    Copy of Healthcare Power of Attorney in Chart? No - copy requested No - copy requested    Would patient like information on creating a medical advance directive?   No - Patient declined No - Patient declined    Current Medications (verified) Outpatient Encounter Medications as of 07/17/2022   Medication Sig   lithium 600 MG capsule Take 1 capsule (600 mg total) by mouth daily.   Multiple Vitamins-Minerals (MENS MULTIVITAMIN PLUS PO) Take by mouth.   Omega-3 Fatty Acids (FISH OIL) 1000 MG CAPS Take by mouth.   Saccharomyces boulardii (PROBIOTIC) 250 MG CAPS Take by mouth.    No facility-administered encounter medications on file as of 07/17/2022.    Allergies (verified) Lithium, Seroquel [quetiapine fumarate], and Aripiprazole   History: Past Medical History:  Diagnosis Date   Depression    Emphysema with chronic bronchitis    Frequent headaches    Hallucinations    History reviewed. No pertinent surgical history. Family History  Problem Relation Age of Onset   Hyperlipidemia Mother    COPD Father    Cancer Father    Depression Brother    Drug abuse Brother    Early death Brother    Mental illness Brother    Depression Sister    Seizures Neg Hx    Parkinson's disease Neg Hx    Dementia Neg Hx    Social History   Socioeconomic History   Marital status: Divorced    Spouse name: Not on file   Number of children: Not on file   Years of education: Not on file   Highest education level: High school graduate  Occupational History   Not on file  Tobacco Use   Smoking status: Former    Years: 30    Types: Cigarettes    Quit date:  03/2018    Years since quitting: 4.3   Smokeless tobacco: Never  Vaping Use   Vaping Use: Never used  Substance and Sexual Activity   Alcohol use: Not Currently   Drug use: Never   Sexual activity: Not Currently    Partners: Female  Other Topics Concern   Not on file  Social History Narrative   Lives with sister   Right handed   Caffeine: 1-2 cups/day   Social Determinants of Health   Financial Resource Strain: Low Risk  (07/17/2022)   Overall Financial Resource Strain (CARDIA)    Difficulty of Paying Living Expenses: Not hard at all  Food Insecurity: No Food Insecurity (07/17/2022)   Hunger Vital Sign    Worried  About Running Out of Food in the Last Year: Never true    Ran Out of Food in the Last Year: Never true  Transportation Needs: No Transportation Needs (07/17/2022)   PRAPARE - Administrator, Civil Service (Medical): No    Lack of Transportation (Non-Medical): No  Physical Activity: Insufficiently Active (07/17/2022)   Exercise Vital Sign    Days of Exercise per Week: 1 day    Minutes of Exercise per Session: 20 min  Stress: No Stress Concern Present (07/17/2022)   [employer] of Occupational Health - Occupational Stress Questionnaire    Feeling of Stress : Not at all  Social Connections: Socially Isolated (07/17/2022)   Social Connection and Isolation Panel [NHANES]    Frequency of Communication with Friends and Family: More than three times a week    Frequency of Social Gatherings with Friends and Family: More than three times a week    Attends Religious Services: Never    Database administrator or Organizations: No    Attends Engineer, structural: Never    Marital Status: Divorced    Tobacco Counseling Counseling given: Not Answered   Clinical Intake:  Pre-visit preparation completed: No  Pain : No/denies pain     BMI - recorded: 32.5 Nutritional Status: BMI > 30  Obese Nutritional Risks: None Diabetes: No  How often do you need to have someone help you when you read instructions, pamphlets, or other written materials from your doctor or pharmacy?: 1 - Never  Diabetic?  No  Interpreter Needed?: No  Information entered by :: Theresa Mulligan LPN   Activities of Daily Living    07/17/2022    8:23 AM  In your present state of health, do you have any difficulty performing the following activities:  Hearing? 0  Vision? 0  Difficulty concentrating or making decisions? 0  Walking or climbing stairs? 0  Dressing or bathing? 0  Doing errands, shopping? 0  Preparing Food and eating ? N  Using the Toilet? N  In the past six months, have you  accidently leaked urine? N  Do you have problems with loss of bowel control? N  Managing your Medications? N  Managing your Finances? N  Housekeeping or managing your Housekeeping? N    Patient Care Team: Kristian Covey, MD as PCP - General (Family Medicine) Cottle, Steva Ready., MD as Attending Physician (Psychiatry)  Indicate any recent Medical Services you may have received from other than [facility] providers in the past year (date may be approximate).     Assessment:   This is a routine wellness examination for [place].  Hearing/Vision screen Hearing Screening - Comments:: Denies hearing difficulties   Vision Screening - Comments:: Wears reading glasses -Not  up to date with routine eye exams  Deferred    Dietary issues and exercise activities discussed: Exercise limited by: None identified   Goals Addressed               This Visit's Progress     No current goals (pt-stated)         Depression Screen    07/20/2022    7:16 AM 12/16/2021    1:14 PM 07/15/2021    8:22 AM 02/15/2018   12:04 PM  PHQ 2/9 Scores  PHQ - 2 Score 0 6 0 6  PHQ- 9 Score  17  27    Fall Risk    07/17/2022    8:23 AM 12/16/2021    1:14 PM 07/15/2021    8:24 AM  Fall Risk   Falls in the past year? 0 0 0  Number falls in past yr: 0 0 0  Injury with Fall? 0 0 0  Risk for fall due to : No Fall Risks No Fall Risks No Fall Risks  Follow up Falls prevention discussed Falls evaluation completed     FALL RISK PREVENTION PERTAINING TO THE HOME:  Any stairs in or around the home? No  If so, are there any without handrails? No  Home free of loose throw rugs in walkways, pet beds, electrical cords, etc? Yes  Adequate lighting in your home to reduce risk of falls? Yes   ASSISTIVE DEVICES UTILIZED TO PREVENT FALLS:  Life alert? No  Use of a cane, walker or w/c? No  Grab bars in the bathroom? Yes  Shower chair or bench in shower? Yes  Elevated toilet seat or a handicapped toilet? No   TIMED  UP AND GO:  Was the test performed? No . Audio Visit   Cognitive Function:    12/28/2019    9:00 AM 11/02/2019   10:28 AM  MMSE - Mini Mental State Exam  Orientation to time 4 1  Orientation to Place 4 2  Registration 3 3  Attention/ Calculation 3 3  Recall 3 3  Language- name 2 objects 2 2  Language- repeat 1 1  Language- follow 3 step command 2 2  Language- read & follow direction 1 0  Write a sentence 1 1  Copy design 1 1  Total score 25 19        07/17/2022    8:24 AM 07/15/2021    8:26 AM  6CIT Screen  What Year? 0 points 0 points  What month? 0 points 0 points  What time? 0 points 0 points  Count back from 20 0 points 0 points  Months in reverse 4 points 0 points  Repeat phrase 0 points 0 points  Total Score 4 points 0 points    Immunizations  There is no immunization history on file for this patient.  TDAP status: Due, Education has been provided regarding the importance of this vaccine. Advised may receive this vaccine at local pharmacy or Health Dept. Aware to provide a copy of the vaccination record if obtained from local pharmacy or Health Dept. Verbalized acceptance and understanding.  Flu Vaccine status: Up to date  Pneumococcal vaccine status: Declined,  Education has been provided regarding the importance of this vaccine but patient still declined. Advised may receive this vaccine at local pharmacy or Health Dept. Aware to provide a copy of the vaccination record if obtained from local pharmacy or Health Dept. Verbalized acceptance and understanding.   Covid-19 vaccine status: Declined,  Education has been provided regarding the importance of this vaccine but patient still declined. Advised may receive this vaccine at local pharmacy or Health Dept.or vaccine clinic. Aware to provide a copy of the vaccination record if obtained from local pharmacy or Health Dept. Verbalized acceptance and understanding.  Qualifies for Shingles Vaccine? Yes   Zostavax  completed No   Shingrix Completed?: No.    Education has been provided regarding the importance of this vaccine. Patient has been advised to call insurance company to determine out of pocket expense if they have not yet received this vaccine. Advised may also receive vaccine at local pharmacy or Health Dept. Verbalized acceptance and understanding.  Screening Tests Health Maintenance  Topic Date Due   DTaP/Tdap/Td (1 - Tdap) Never done   COVID-19 Vaccine (1) 08/02/2022 (Originally 06/01/1960)   Zoster Vaccines- Shingrix (1 of 2) 10/17/2022 (Originally 05/31/1976)   Lung Cancer Screening  07/17/2023 (Originally 06/01/2007)   Pneumonia Vaccine 10+ Years old (1 of 2 - PCV) 07/17/2023 (Originally 06/01/1961)   COLONOSCOPY (Pts 45-82yrs Insurance coverage will need to be confirmed)  07/17/2023 (Originally 06/01/2002)   Hepatitis C Screening  07/17/2023 (Originally 06/01/1975)   INFLUENZA VACCINE  10/01/2022   Medicare Annual Wellness (AWV)  07/17/2023   HIV Screening  Completed   HPV VACCINES  Aged Out    Health Maintenance  Health Maintenance Due  Topic Date Due   DTaP/Tdap/Td (1 - Tdap) Never done    Colorectal cancer screening: Referral to GI placed Patient declined. Pt aware the office will call re: appt.  Lung Cancer Screening: (Low Dose CT Chest recommended if Age 71-80 years, 30 pack-year currently smoking OR have quit w/in 15years.) does qualify.   Lung Cancer Screening Referral: Deferred  Additional Screening:  Hepatitis C Screening: does qualify; Deferred  Vision Screening: Recommended annual ophthalmology exams for early detection of glaucoma and other disorders of the eye. Is the patient up to date with their annual eye exam?  No  Who is the provider or what is the name of the office in which the patient attends annual eye exams? Deferred If pt is not established with a provider, would they like to be referred to a provider to establish care? No .   Dental Screening: Recommended  annual dental exams for proper oral hygiene  Community Resource Referral / Chronic Care Management:  CRR required this visit?  No   CCM required this visit?  No      Plan:     I have personally reviewed and noted the following in the patient's chart:   Medical and social history Use of alcohol, tobacco or illicit drugs  Current medications and supplements including opioid prescriptions. Patient is not currently taking opioid prescriptions. Functional ability and status Nutritional status Physical activity Advanced directives List of other physicians Hospitalizations, surgeries, and ER visits in previous 12 months Vitals Screenings to include cognitive, depression, and falls Referrals and appointments  In addition, I have reviewed and discussed with patient certain preventive protocols, quality metrics, and best practice recommendations. A written personalized care plan for preventive services as well as general preventive health recommendations were provided to patient.     [name], LPN   1/61/0960   Nurse Notes: Patient due Hep-C Screening  Depression screening was completed during this AWV with score of 0

## 2022-12-14 ENCOUNTER — Ambulatory Visit: Payer: Medicare HMO | Admitting: Family Medicine

## 2022-12-14 ENCOUNTER — Other Ambulatory Visit (INDEPENDENT_AMBULATORY_CARE_PROVIDER_SITE_OTHER): Payer: Medicare HMO

## 2022-12-14 ENCOUNTER — Encounter: Payer: Self-pay | Admitting: Family Medicine

## 2022-12-14 VITALS — BP 132/82 | HR 114 | Temp 98.5°F | Ht 75.0 in | Wt 280.0 lb

## 2022-12-14 DIAGNOSIS — Z79899 Other long term (current) drug therapy: Secondary | ICD-10-CM | POA: Diagnosis not present

## 2022-12-14 DIAGNOSIS — R7303 Prediabetes: Secondary | ICD-10-CM | POA: Diagnosis not present

## 2022-12-14 DIAGNOSIS — R5383 Other fatigue: Secondary | ICD-10-CM

## 2022-12-14 DIAGNOSIS — F319 Bipolar disorder, unspecified: Secondary | ICD-10-CM | POA: Diagnosis not present

## 2022-12-14 DIAGNOSIS — R531 Weakness: Secondary | ICD-10-CM | POA: Diagnosis not present

## 2022-12-14 LAB — CBC WITH DIFFERENTIAL/PLATELET
Basophils Absolute: 0 10*3/uL (ref 0.0–0.1)
Basophils Relative: 0.6 % (ref 0.0–3.0)
Eosinophils Absolute: 0 10*3/uL (ref 0.0–0.7)
Eosinophils Relative: 0.7 % (ref 0.0–5.0)
HCT: 50.8 % (ref 39.0–52.0)
Hemoglobin: 16.4 g/dL (ref 13.0–17.0)
Lymphocytes Relative: 14.9 % (ref 12.0–46.0)
Lymphs Abs: 1.1 10*3/uL (ref 0.7–4.0)
MCHC: 32.2 g/dL (ref 30.0–36.0)
MCV: 93.2 fL (ref 78.0–100.0)
Monocytes Absolute: 0.5 10*3/uL (ref 0.1–1.0)
Monocytes Relative: 6.5 % (ref 3.0–12.0)
Neutro Abs: 5.8 10*3/uL (ref 1.4–7.7)
Neutrophils Relative %: 77.3 % — ABNORMAL HIGH (ref 43.0–77.0)
Platelets: 255 10*3/uL (ref 150.0–400.0)
RBC: 5.45 Mil/uL (ref 4.22–5.81)
RDW: 14 % (ref 11.5–15.5)
WBC: 7.5 10*3/uL (ref 4.0–10.5)

## 2022-12-14 LAB — COMPREHENSIVE METABOLIC PANEL
ALT: 23 U/L (ref 0–53)
AST: 19 U/L (ref 0–37)
Albumin: 4.4 g/dL (ref 3.5–5.2)
Alkaline Phosphatase: 113 U/L (ref 39–117)
BUN: 16 mg/dL (ref 6–23)
CO2: 21 meq/L (ref 19–32)
Calcium: 9.6 mg/dL (ref 8.4–10.5)
Chloride: 106 meq/L (ref 96–112)
Creatinine, Ser: 1.11 mg/dL (ref 0.40–1.50)
GFR: 69.67 mL/min (ref >60.00)
Glucose, Bld: 122 mg/dL — ABNORMAL HIGH (ref 70–99)
Potassium: 3.9 meq/L (ref 3.5–5.1)
Sodium: 138 meq/L (ref 135–145)
Total Bilirubin: 0.6 mg/dL (ref 0.2–1.2)
Total Protein: 7.6 g/dL (ref 6.0–8.3)

## 2022-12-14 LAB — HEMOGLOBIN A1C: Hgb A1c MFr Bld: 5.7 % (ref 4.6–6.5)

## 2022-12-14 LAB — TSH: TSH: 2.07 u[IU]/mL (ref 0.35–5.50)

## 2022-12-14 NOTE — Progress Notes (Signed)
Established Patient Office Visit  Subjective   Patient ID: William Frye, male    DOB: Jul 14, 1955  Age: 67 y.o. MRN: [id number]  Chief Complaint  Patient presents with   Medication Consultation    HPI   William Frye is here accompanied by sister with history of bipolar 1 disorder, recurrent major depression, prediabetes.  He is currently living his mother who is 65 years old in [place].  His sister relates that he spends most of his day in the bed and basically only gets up to eat.  They have had concerns regarding some progressive weakness which they attribute to inactivity.  Somewhat of a shuffling gait.  No tremors and no significant muscle rigidity.  Had a couple falls in the past year.  Seems to have extremely low motivation.  No reported suicidal ideation.  Currently takes lithium 600 mg by mouth daily.  Due for follow-up labs.  Does have history of prediabetes by lab work.  He has gained substantial weight in the past year (about 9-10 pounds) and sister is concerned about his diabetes risk.  He previously had seen psychiatry and basically had a falling out with his psychiatrist and we had agreed to prescribe his lithium for now.  He has been reluctant to go back and see any other psychiatrist.  Even though there seems to be some low motivation and perhaps some depression ongoing he has refused other medications.  He has been reportedly compliant with the lithium therapy.  Past Medical History:  Diagnosis Date   Depression    Emphysema with chronic bronchitis (HCC)    Frequent headaches    Hallucinations    History reviewed. No pertinent surgical history.  reports that he quit smoking about 4 years ago. His smoking use included cigarettes. He started smoking about 34 years ago. He has never used smokeless tobacco. He reports that he does not currently use alcohol. He reports that he does not use drugs. family history includes COPD in his father; Cancer in his father; Depression in his  brother and sister; Drug abuse in his brother; Early death in his brother; Hyperlipidemia in his mother; Mental illness in his brother. Allergies  Allergen Reactions   Lithium Diarrhea and Nausea Only   Seroquel [Quetiapine Fumarate]     Psychosis   Aripiprazole Other (See Comments)    Auditory and visual hallucinations    Review of Systems  Constitutional:  Negative for chills and fever.  Respiratory:  Negative for shortness of breath.   Cardiovascular:  Negative for chest pain.  Psychiatric/Behavioral:  Positive for depression. Negative for substance abuse and suicidal ideas. The patient is nervous/anxious.       Objective:     BP 132/82 (BP Location: Left Arm, Cuff Size: Normal)   Pulse (!) 114   Temp 98.5 F (36.9 C) (Oral)   Ht 6\' 3"  (1.905 m)   Wt 280 lb (127 kg)   SpO2 96%   BMI 35.00 kg/m  BP Readings from Last 3 Encounters:  12/14/22 132/82  12/16/21 120/80  11/06/20 120/80   Wt Readings from Last 3 Encounters:  12/14/22 280 lb (127 kg)  07/17/22 260 lb (117.9 kg)  12/16/21 271 lb 3.2 oz (123 kg)      Physical Exam Vitals reviewed.  Constitutional:      Comments: Patient is alert and cooperative but somewhat anxious in appearance and seems somewhat fidgety  Cardiovascular:     Rate and Rhythm: Normal rate and regular rhythm.  Pulmonary:     Effort: Pulmonary effort is normal.     Breath sounds: Normal breath sounds. No wheezing or rales.  Neurological:     General: No focal deficit present.      No results found for any visits on 12/14/22.  Last CBC Lab Results  Component Value Date   WBC 6.9 12/16/2021   HGB 16.7 12/16/2021   HCT 50.3 12/16/2021   MCV 92.8 12/16/2021   MCH 30.0 01/04/2019   RDW 14.0 12/16/2021   PLT 258.0 12/16/2021   Last metabolic panel Lab Results  Component Value Date   GLUCOSE 152 (H) 12/16/2021   NA 139 12/16/2021   K 4.0 12/16/2021   CL 105 12/16/2021   CO2 23 12/16/2021   BUN 16 12/16/2021   CREATININE  1.12 12/16/2021   GFR 69.41 12/16/2021   CALCIUM 9.7 12/16/2021   PROT 7.6 12/16/2021   ALBUMIN 4.5 12/16/2021   BILITOT 0.7 12/16/2021   ALKPHOS 118 (H) 12/16/2021   AST 19 12/16/2021   ALT 21 12/16/2021   ANIONGAP 10 01/04/2019   Last lipids No results found for: "CHOL", "HDL", "LDLCALC", "LDLDIRECT", "TRIG", "CHOLHDL" Last hemoglobin A1c Lab Results  Component Value Date   HGBA1C 5.7 12/16/2021   Last thyroid functions Lab Results  Component Value Date   TSH 2.26 12/16/2021   Last vitamin B12 and Folate Lab Results  Component Value Date   VITAMINB12 953 11/02/2019      The ASCVD Risk score (Arnett DK, et al., 2019) failed to calculate for the following reasons:   Cannot find a previous HDL lab   Cannot find a previous total cholesterol lab    Assessment & Plan:   Problem List Items Addressed This Visit       Unprioritized   Prediabetes - Primary   Relevant Orders   Hemoglobin A1c   Bipolar 1 disorder (HCC)   Other Visit Diagnoses     High risk medication use       Relevant Orders   CBC with Differential/Platelet   CMP   Lithium level   Fatigue, unspecified type       Relevant Orders   TSH   Vitamin B12     [name] has history as above.  Currently very low motivation and spends most of his day sleeping in bed.  We discussed several issues as follows  -Needs follow-up labs as above -We expressed our concern regarding his gradual weight gain and increasing risks of hypertension and diabetes as well as progressive weakness.  He and his mother currently usually do takeout meals several times per week-and suspect tremendous amount of sodium intake -Discussed possible home physical therapy as he does not drive.  He refuses this today. -We did discuss possible additional therapy for his depression and low motivation such as Wellbutrin.  He declines.  In fact, he declines any other additional medications. -We also discussed possible referral to another  psychiatrist but he declines at this time -We have encouraged him to try to stay engaged daily with activities as much as possible  No follow-ups on file.    Evelena Peat, MD

## 2022-12-15 LAB — VITAMIN B12: Vitamin B-12: 468 pg/mL (ref 211–911)

## 2022-12-15 LAB — LITHIUM LEVEL: Lithium Lvl: 0.4 mmol/L — ABNORMAL LOW (ref 0.6–1.2)

## 2022-12-16 ENCOUNTER — Other Ambulatory Visit: Payer: Self-pay | Admitting: Family Medicine

## 2022-12-16 DIAGNOSIS — F319 Bipolar disorder, unspecified: Secondary | ICD-10-CM

## 2022-12-29 ENCOUNTER — Encounter: Payer: Self-pay | Admitting: Family Medicine

## 2023-01-21 DIAGNOSIS — Z79899 Other long term (current) drug therapy: Secondary | ICD-10-CM | POA: Diagnosis not present

## 2023-01-21 DIAGNOSIS — F319 Bipolar disorder, unspecified: Secondary | ICD-10-CM | POA: Diagnosis not present

## 2023-01-21 DIAGNOSIS — F411 Generalized anxiety disorder: Secondary | ICD-10-CM | POA: Diagnosis not present

## 2023-02-01 DIAGNOSIS — Z79899 Other long term (current) drug therapy: Secondary | ICD-10-CM | POA: Diagnosis not present

## 2023-02-16 DIAGNOSIS — F319 Bipolar disorder, unspecified: Secondary | ICD-10-CM | POA: Diagnosis not present

## 2023-02-16 DIAGNOSIS — Z79899 Other long term (current) drug therapy: Secondary | ICD-10-CM | POA: Diagnosis not present

## 2023-02-16 DIAGNOSIS — F411 Generalized anxiety disorder: Secondary | ICD-10-CM | POA: Diagnosis not present

## 2023-06-28 DIAGNOSIS — Z79899 Other long term (current) drug therapy: Secondary | ICD-10-CM | POA: Diagnosis not present

## 2023-07-21 ENCOUNTER — Telehealth: Payer: Self-pay

## 2023-07-21 NOTE — Telephone Encounter (Signed)
Contacted patient on preferred number listed in notes for scheduled AWV. Patient stated unable to complete visit today will call back to reschedule.

## 2023-09-07 ENCOUNTER — Telehealth: Payer: Self-pay

## 2023-09-07 NOTE — Telephone Encounter (Signed)
 Copied from CRM [id number]. Topic: General - Other >> Sep 07, 2023 12:24 PM Suzen RAMAN wrote: Reason for CRM: Patient sister would like know if a standard FL2 form completed. Patient needs are assistance with walking, high risk for falling needs assistance with bathing,dressing, and using the bathroom. Minor hallucinations. Patient sister would like complete document email to her if possible. [email or web address] CB# [phone].

## 2023-09-08 DIAGNOSIS — Z79899 Other long term (current) drug therapy: Secondary | ICD-10-CM | POA: Diagnosis not present

## 2023-09-08 DIAGNOSIS — Z743 Need for continuous supervision: Secondary | ICD-10-CM | POA: Diagnosis not present

## 2023-09-08 DIAGNOSIS — T43595A Adverse effect of other antipsychotics and neuroleptics, initial encounter: Secondary | ICD-10-CM | POA: Diagnosis not present

## 2023-09-08 DIAGNOSIS — R350 Frequency of micturition: Secondary | ICD-10-CM | POA: Diagnosis not present

## 2023-09-08 DIAGNOSIS — Z7401 Bed confinement status: Secondary | ICD-10-CM | POA: Diagnosis not present

## 2023-09-08 DIAGNOSIS — R410 Disorientation, unspecified: Secondary | ICD-10-CM | POA: Diagnosis not present

## 2023-09-08 DIAGNOSIS — R531 Weakness: Secondary | ICD-10-CM | POA: Diagnosis not present

## 2023-09-08 DIAGNOSIS — W19XXXA Unspecified fall, initial encounter: Secondary | ICD-10-CM | POA: Diagnosis not present

## 2023-09-08 DIAGNOSIS — R627 Adult failure to thrive: Secondary | ICD-10-CM | POA: Diagnosis not present

## 2023-09-08 DIAGNOSIS — R2681 Unsteadiness on feet: Secondary | ICD-10-CM | POA: Diagnosis not present

## 2023-09-08 DIAGNOSIS — R231 Pallor: Secondary | ICD-10-CM | POA: Diagnosis not present

## 2023-09-09 ENCOUNTER — Telehealth: Payer: Self-pay | Admitting: *Deleted

## 2023-09-09 NOTE — Telephone Encounter (Signed)
 Communication  Reason for CRM: Sister [name] would like to know if we could possibly upload the FL2 form to MyChart and email it to her directly as well so she could print it out.        Her email address is    [email or web address]

## 2023-09-09 NOTE — Telephone Encounter (Signed)
 Patient's sister [name] husband will pick up forms tomorrow for patient

## 2023-09-10 ENCOUNTER — Ambulatory Visit: Admitting: Family Medicine

## 2023-09-10 NOTE — Telephone Encounter (Signed)
 Patient's sister [name] husband will pick up forms today for patient

## 2023-09-14 DIAGNOSIS — R531 Weakness: Secondary | ICD-10-CM | POA: Diagnosis not present

## 2023-09-15 DIAGNOSIS — R5381 Other malaise: Secondary | ICD-10-CM | POA: Diagnosis not present

## 2023-09-15 DIAGNOSIS — R2681 Unsteadiness on feet: Secondary | ICD-10-CM | POA: Diagnosis not present

## 2023-09-15 DIAGNOSIS — R531 Weakness: Secondary | ICD-10-CM | POA: Diagnosis not present

## 2023-09-16 DIAGNOSIS — R531 Weakness: Secondary | ICD-10-CM | POA: Diagnosis not present

## 2023-09-16 DIAGNOSIS — R5381 Other malaise: Secondary | ICD-10-CM | POA: Diagnosis not present

## 2023-09-17 DIAGNOSIS — R5381 Other malaise: Secondary | ICD-10-CM | POA: Diagnosis not present

## 2023-09-17 DIAGNOSIS — G4700 Insomnia, unspecified: Secondary | ICD-10-CM | POA: Diagnosis not present

## 2023-09-17 DIAGNOSIS — R531 Weakness: Secondary | ICD-10-CM | POA: Diagnosis not present

## 2023-09-20 DIAGNOSIS — R2681 Unsteadiness on feet: Secondary | ICD-10-CM | POA: Diagnosis not present

## 2023-09-20 DIAGNOSIS — R531 Weakness: Secondary | ICD-10-CM | POA: Diagnosis not present

## 2023-09-20 DIAGNOSIS — R5381 Other malaise: Secondary | ICD-10-CM | POA: Diagnosis not present

## 2023-09-20 DIAGNOSIS — G4700 Insomnia, unspecified: Secondary | ICD-10-CM | POA: Diagnosis not present

## 2023-09-21 DIAGNOSIS — G4700 Insomnia, unspecified: Secondary | ICD-10-CM | POA: Diagnosis not present

## 2023-09-21 DIAGNOSIS — R531 Weakness: Secondary | ICD-10-CM | POA: Diagnosis not present

## 2023-09-21 DIAGNOSIS — R070 Pain in throat: Secondary | ICD-10-CM | POA: Diagnosis not present

## 2023-09-23 DIAGNOSIS — R531 Weakness: Secondary | ICD-10-CM | POA: Diagnosis not present

## 2023-09-23 DIAGNOSIS — R070 Pain in throat: Secondary | ICD-10-CM | POA: Diagnosis not present

## 2023-09-27 DIAGNOSIS — R531 Weakness: Secondary | ICD-10-CM | POA: Diagnosis not present

## 2023-09-30 DIAGNOSIS — R531 Weakness: Secondary | ICD-10-CM | POA: Diagnosis not present

## 2023-10-06 DIAGNOSIS — R531 Weakness: Secondary | ICD-10-CM | POA: Diagnosis not present

## 2023-10-08 DIAGNOSIS — R2681 Unsteadiness on feet: Secondary | ICD-10-CM | POA: Diagnosis not present

## 2023-10-08 DIAGNOSIS — M6281 Muscle weakness (generalized): Secondary | ICD-10-CM | POA: Diagnosis not present

## 2023-10-11 DIAGNOSIS — R42 Dizziness and giddiness: Secondary | ICD-10-CM | POA: Diagnosis not present

## 2023-10-11 DIAGNOSIS — M6281 Muscle weakness (generalized): Secondary | ICD-10-CM | POA: Diagnosis not present

## 2023-10-11 DIAGNOSIS — R531 Weakness: Secondary | ICD-10-CM | POA: Diagnosis not present

## 2023-10-11 DIAGNOSIS — R2681 Unsteadiness on feet: Secondary | ICD-10-CM | POA: Diagnosis not present

## 2023-10-13 DIAGNOSIS — R531 Weakness: Secondary | ICD-10-CM | POA: Diagnosis not present

## 2023-10-13 DIAGNOSIS — G4700 Insomnia, unspecified: Secondary | ICD-10-CM | POA: Diagnosis not present

## 2023-10-14 DIAGNOSIS — E559 Vitamin D deficiency, unspecified: Secondary | ICD-10-CM | POA: Diagnosis not present

## 2023-10-14 DIAGNOSIS — R5381 Other malaise: Secondary | ICD-10-CM | POA: Diagnosis not present

## 2023-10-14 DIAGNOSIS — Z130 Encounter for screening for diseases of the blood and blood-forming organs and certain disorders involving the immune mechanism: Secondary | ICD-10-CM | POA: Diagnosis not present

## 2023-10-14 DIAGNOSIS — D510 Vitamin B12 deficiency anemia due to intrinsic factor deficiency: Secondary | ICD-10-CM | POA: Diagnosis not present

## 2023-11-10 DIAGNOSIS — K219 Gastro-esophageal reflux disease without esophagitis: Secondary | ICD-10-CM | POA: Diagnosis not present

## 2023-11-11 DIAGNOSIS — M6281 Muscle weakness (generalized): Secondary | ICD-10-CM | POA: Diagnosis not present

## 2023-11-11 DIAGNOSIS — R9431 Abnormal electrocardiogram [ECG] [EKG]: Secondary | ICD-10-CM | POA: Diagnosis not present

## 2023-11-11 DIAGNOSIS — I2109 ST elevation (STEMI) myocardial infarction involving other coronary artery of anterior wall: Secondary | ICD-10-CM | POA: Diagnosis not present

## 2023-11-11 DIAGNOSIS — I2102 ST elevation (STEMI) myocardial infarction involving left anterior descending coronary artery: Secondary | ICD-10-CM | POA: Diagnosis not present

## 2023-11-11 DIAGNOSIS — R110 Nausea: Secondary | ICD-10-CM | POA: Diagnosis not present

## 2023-11-11 DIAGNOSIS — I25119 Atherosclerotic heart disease of native coronary artery with unspecified angina pectoris: Secondary | ICD-10-CM | POA: Diagnosis not present

## 2023-11-11 DIAGNOSIS — I498 Other specified cardiac arrhythmias: Secondary | ICD-10-CM | POA: Diagnosis not present

## 2023-11-11 DIAGNOSIS — Z66 Do not resuscitate: Secondary | ICD-10-CM | POA: Diagnosis not present

## 2023-11-11 DIAGNOSIS — I499 Cardiac arrhythmia, unspecified: Secondary | ICD-10-CM | POA: Diagnosis not present

## 2023-11-11 DIAGNOSIS — R001 Bradycardia, unspecified: Secondary | ICD-10-CM | POA: Diagnosis not present

## 2023-11-11 DIAGNOSIS — I5189 Other ill-defined heart diseases: Secondary | ICD-10-CM | POA: Diagnosis not present

## 2023-11-11 DIAGNOSIS — R1310 Dysphagia, unspecified: Secondary | ICD-10-CM | POA: Diagnosis not present

## 2023-11-11 DIAGNOSIS — K219 Gastro-esophageal reflux disease without esophagitis: Secondary | ICD-10-CM | POA: Diagnosis not present

## 2023-11-11 DIAGNOSIS — R61 Generalized hyperhidrosis: Secondary | ICD-10-CM | POA: Diagnosis not present

## 2023-11-11 DIAGNOSIS — I2510 Atherosclerotic heart disease of native coronary artery without angina pectoris: Secondary | ICD-10-CM | POA: Diagnosis not present

## 2023-11-11 DIAGNOSIS — R079 Chest pain, unspecified: Secondary | ICD-10-CM | POA: Diagnosis not present

## 2023-11-11 DIAGNOSIS — F17210 Nicotine dependence, cigarettes, uncomplicated: Secondary | ICD-10-CM | POA: Diagnosis not present

## 2023-11-11 DIAGNOSIS — Z79899 Other long term (current) drug therapy: Secondary | ICD-10-CM | POA: Diagnosis not present

## 2023-11-11 DIAGNOSIS — R269 Unspecified abnormalities of gait and mobility: Secondary | ICD-10-CM | POA: Diagnosis not present

## 2023-11-11 DIAGNOSIS — I213 ST elevation (STEMI) myocardial infarction of unspecified site: Secondary | ICD-10-CM | POA: Diagnosis not present

## 2023-11-13 DIAGNOSIS — I2510 Atherosclerotic heart disease of native coronary artery without angina pectoris: Secondary | ICD-10-CM | POA: Diagnosis not present

## 2023-11-13 DIAGNOSIS — R1310 Dysphagia, unspecified: Secondary | ICD-10-CM | POA: Diagnosis not present

## 2023-11-13 DIAGNOSIS — I25119 Atherosclerotic heart disease of native coronary artery with unspecified angina pectoris: Secondary | ICD-10-CM | POA: Diagnosis not present

## 2023-11-13 DIAGNOSIS — R5381 Other malaise: Secondary | ICD-10-CM | POA: Diagnosis not present

## 2023-11-13 DIAGNOSIS — K219 Gastro-esophageal reflux disease without esophagitis: Secondary | ICD-10-CM | POA: Diagnosis not present

## 2023-11-13 DIAGNOSIS — Z79899 Other long term (current) drug therapy: Secondary | ICD-10-CM | POA: Diagnosis not present

## 2023-11-13 DIAGNOSIS — M6281 Muscle weakness (generalized): Secondary | ICD-10-CM | POA: Diagnosis not present

## 2023-11-13 DIAGNOSIS — R1013 Epigastric pain: Secondary | ICD-10-CM | POA: Diagnosis not present

## 2023-11-13 DIAGNOSIS — R269 Unspecified abnormalities of gait and mobility: Secondary | ICD-10-CM | POA: Diagnosis not present

## 2023-11-13 DIAGNOSIS — Z7982 Long term (current) use of aspirin: Secondary | ICD-10-CM | POA: Diagnosis not present

## 2023-11-13 DIAGNOSIS — I213 ST elevation (STEMI) myocardial infarction of unspecified site: Secondary | ICD-10-CM | POA: Diagnosis not present

## 2023-11-16 DIAGNOSIS — R5381 Other malaise: Secondary | ICD-10-CM | POA: Diagnosis not present

## 2023-11-16 DIAGNOSIS — R1013 Epigastric pain: Secondary | ICD-10-CM | POA: Diagnosis not present

## 2023-11-16 DIAGNOSIS — Z7982 Long term (current) use of aspirin: Secondary | ICD-10-CM | POA: Diagnosis not present

## 2023-11-16 DIAGNOSIS — I213 ST elevation (STEMI) myocardial infarction of unspecified site: Secondary | ICD-10-CM | POA: Diagnosis not present

## 2023-11-16 DIAGNOSIS — I2510 Atherosclerotic heart disease of native coronary artery without angina pectoris: Secondary | ICD-10-CM | POA: Diagnosis not present

## 2023-11-29 DIAGNOSIS — I25119 Atherosclerotic heart disease of native coronary artery with unspecified angina pectoris: Secondary | ICD-10-CM | POA: Diagnosis not present

## 2023-11-29 DIAGNOSIS — R269 Unspecified abnormalities of gait and mobility: Secondary | ICD-10-CM | POA: Diagnosis not present

## 2023-11-29 DIAGNOSIS — R1310 Dysphagia, unspecified: Secondary | ICD-10-CM | POA: Diagnosis not present

## 2023-11-29 DIAGNOSIS — I213 ST elevation (STEMI) myocardial infarction of unspecified site: Secondary | ICD-10-CM | POA: Diagnosis not present

## 2023-11-29 DIAGNOSIS — M6281 Muscle weakness (generalized): Secondary | ICD-10-CM | POA: Diagnosis not present

## 2023-11-30 DIAGNOSIS — R269 Unspecified abnormalities of gait and mobility: Secondary | ICD-10-CM | POA: Diagnosis not present

## 2023-11-30 DIAGNOSIS — I25119 Atherosclerotic heart disease of native coronary artery with unspecified angina pectoris: Secondary | ICD-10-CM | POA: Diagnosis not present

## 2023-11-30 DIAGNOSIS — M6281 Muscle weakness (generalized): Secondary | ICD-10-CM | POA: Diagnosis not present

## 2023-11-30 DIAGNOSIS — R1310 Dysphagia, unspecified: Secondary | ICD-10-CM | POA: Diagnosis not present

## 2023-11-30 DIAGNOSIS — I213 ST elevation (STEMI) myocardial infarction of unspecified site: Secondary | ICD-10-CM | POA: Diagnosis not present

## 2023-12-09 DIAGNOSIS — Z9862 Peripheral vascular angioplasty status: Secondary | ICD-10-CM | POA: Diagnosis not present

## 2024-02-17 ENCOUNTER — Ambulatory Visit: Admitting: Family Medicine
